# Patient Record
Sex: Female | Born: 1950 | Race: White | Hispanic: No | Marital: Married | Smoking: Never smoker
Health system: Southern US, Community
[De-identification: ages and names within clinical notes are randomized; demographics above are authoritative.]

## PROBLEM LIST (undated history)

## (undated) DIAGNOSIS — F32A Depression, unspecified: Secondary | ICD-10-CM

## (undated) DIAGNOSIS — F411 Generalized anxiety disorder: Secondary | ICD-10-CM

## (undated) DIAGNOSIS — M1990 Unspecified osteoarthritis, unspecified site: Secondary | ICD-10-CM

## (undated) DIAGNOSIS — F329 Major depressive disorder, single episode, unspecified: Secondary | ICD-10-CM

## (undated) DIAGNOSIS — M810 Age-related osteoporosis without current pathological fracture: Secondary | ICD-10-CM

## (undated) DIAGNOSIS — Z862 Personal history of diseases of the blood and blood-forming organs and certain disorders involving the immune mechanism: Secondary | ICD-10-CM

## (undated) HISTORY — DX: Generalized anxiety disorder: F41.1

## (undated) HISTORY — DX: Unspecified osteoarthritis, unspecified site: M19.90

## (undated) HISTORY — PX: FOOT SURGERY: SHX648

## (undated) HISTORY — PX: DILATION AND CURETTAGE OF UTERUS: SHX78

---

## 1999-06-29 ENCOUNTER — Other Ambulatory Visit: Admission: RE | Admit: 1999-06-29 | Discharge: 1999-06-29 | Payer: Self-pay | Admitting: Obstetrics and Gynecology

## 1999-10-13 ENCOUNTER — Encounter: Admission: RE | Admit: 1999-10-13 | Discharge: 1999-10-13 | Payer: Self-pay | Admitting: *Deleted

## 1999-10-13 ENCOUNTER — Encounter: Payer: Self-pay | Admitting: *Deleted

## 2001-05-01 ENCOUNTER — Encounter: Admission: RE | Admit: 2001-05-01 | Discharge: 2001-05-01 | Payer: Self-pay

## 2001-05-01 ENCOUNTER — Encounter: Payer: Self-pay | Admitting: Obstetrics & Gynecology

## 2001-05-29 ENCOUNTER — Other Ambulatory Visit: Admission: RE | Admit: 2001-05-29 | Discharge: 2001-05-29 | Payer: Self-pay | Admitting: Obstetrics and Gynecology

## 2002-05-19 ENCOUNTER — Encounter: Admission: RE | Admit: 2002-05-19 | Discharge: 2002-05-19 | Payer: Self-pay | Admitting: Obstetrics and Gynecology

## 2002-05-19 ENCOUNTER — Encounter: Payer: Self-pay | Admitting: Obstetrics and Gynecology

## 2002-06-04 ENCOUNTER — Other Ambulatory Visit: Admission: RE | Admit: 2002-06-04 | Discharge: 2002-06-04 | Payer: Self-pay | Admitting: Obstetrics and Gynecology

## 2003-06-03 ENCOUNTER — Encounter: Admission: RE | Admit: 2003-06-03 | Discharge: 2003-06-03 | Payer: Self-pay | Admitting: Obstetrics and Gynecology

## 2003-08-03 ENCOUNTER — Other Ambulatory Visit: Admission: RE | Admit: 2003-08-03 | Discharge: 2003-08-03 | Payer: Self-pay | Admitting: Obstetrics and Gynecology

## 2003-08-25 ENCOUNTER — Encounter: Admission: RE | Admit: 2003-08-25 | Discharge: 2003-08-25 | Payer: Self-pay | Admitting: Obstetrics and Gynecology

## 2004-09-27 ENCOUNTER — Encounter: Admission: RE | Admit: 2004-09-27 | Discharge: 2004-09-27 | Payer: Self-pay | Admitting: Obstetrics and Gynecology

## 2004-10-26 ENCOUNTER — Other Ambulatory Visit: Admission: RE | Admit: 2004-10-26 | Discharge: 2004-10-26 | Payer: Self-pay | Admitting: Obstetrics and Gynecology

## 2004-11-02 ENCOUNTER — Encounter: Admission: RE | Admit: 2004-11-02 | Discharge: 2004-11-02 | Payer: Self-pay | Admitting: Obstetrics and Gynecology

## 2005-10-12 ENCOUNTER — Encounter: Admission: RE | Admit: 2005-10-12 | Discharge: 2005-10-12 | Payer: Self-pay | Admitting: Family Medicine

## 2005-12-14 ENCOUNTER — Other Ambulatory Visit: Admission: RE | Admit: 2005-12-14 | Discharge: 2005-12-14 | Payer: Self-pay | Admitting: Obstetrics and Gynecology

## 2006-10-15 ENCOUNTER — Encounter: Admission: RE | Admit: 2006-10-15 | Discharge: 2006-10-15 | Payer: Self-pay | Admitting: Obstetrics and Gynecology

## 2007-11-05 ENCOUNTER — Encounter: Admission: RE | Admit: 2007-11-05 | Discharge: 2007-11-05 | Payer: Self-pay | Admitting: Obstetrics and Gynecology

## 2008-12-22 ENCOUNTER — Encounter: Admission: RE | Admit: 2008-12-22 | Discharge: 2008-12-22 | Payer: Self-pay | Admitting: Obstetrics and Gynecology

## 2008-12-30 ENCOUNTER — Ambulatory Visit: Payer: Self-pay | Admitting: Orthopedic Surgery

## 2008-12-30 DIAGNOSIS — M7150 Other bursitis, not elsewhere classified, unspecified site: Secondary | ICD-10-CM | POA: Insufficient documentation

## 2008-12-30 DIAGNOSIS — M25549 Pain in joints of unspecified hand: Secondary | ICD-10-CM

## 2009-01-06 ENCOUNTER — Ambulatory Visit: Payer: Self-pay | Admitting: Orthopedic Surgery

## 2010-01-04 ENCOUNTER — Encounter: Admission: RE | Admit: 2010-01-04 | Discharge: 2010-01-04 | Payer: Self-pay | Admitting: Obstetrics and Gynecology

## 2010-07-15 ENCOUNTER — Encounter
Admission: RE | Admit: 2010-07-15 | Discharge: 2010-07-15 | Payer: Self-pay | Source: Home / Self Care | Attending: Obstetrics and Gynecology | Admitting: Obstetrics and Gynecology

## 2011-02-02 ENCOUNTER — Other Ambulatory Visit: Payer: Self-pay | Admitting: Obstetrics and Gynecology

## 2011-02-02 DIAGNOSIS — Z1231 Encounter for screening mammogram for malignant neoplasm of breast: Secondary | ICD-10-CM

## 2011-02-09 ENCOUNTER — Ambulatory Visit
Admission: RE | Admit: 2011-02-09 | Discharge: 2011-02-09 | Disposition: A | Discharge status: Home / Self Care | Payer: 59 | Source: Ambulatory Visit | Attending: Obstetrics and Gynecology | Admitting: Obstetrics and Gynecology

## 2011-02-09 DIAGNOSIS — Z1231 Encounter for screening mammogram for malignant neoplasm of breast: Secondary | ICD-10-CM

## 2011-02-13 ENCOUNTER — Ambulatory Visit: Payer: Self-pay

## 2011-09-13 ENCOUNTER — Telehealth (INDEPENDENT_AMBULATORY_CARE_PROVIDER_SITE_OTHER): Payer: Self-pay | Admitting: *Deleted

## 2011-09-13 ENCOUNTER — Encounter (INDEPENDENT_AMBULATORY_CARE_PROVIDER_SITE_OTHER): Payer: Self-pay | Admitting: *Deleted

## 2011-09-13 ENCOUNTER — Other Ambulatory Visit (INDEPENDENT_AMBULATORY_CARE_PROVIDER_SITE_OTHER): Payer: Self-pay | Admitting: *Deleted

## 2011-09-13 DIAGNOSIS — Z1211 Encounter for screening for malignant neoplasm of colon: Secondary | ICD-10-CM

## 2011-09-13 NOTE — Telephone Encounter (Signed)
Patient needs movi prep 

## 2011-09-14 MED ORDER — PEG-KCL-NACL-NASULF-NA ASC-C 100 G PO SOLR: 1.0000 | Freq: Once | ORAL | Status: DC

## 2011-10-17 ENCOUNTER — Telehealth (INDEPENDENT_AMBULATORY_CARE_PROVIDER_SITE_OTHER): Payer: Self-pay | Admitting: *Deleted

## 2011-10-17 NOTE — Telephone Encounter (Signed)
Requesting MD/PCP:  Lorin Picket luking     Name & DOB: Tiffany Perry 12-12-2050     Procedure: tcs  Reason/Indication:  Screening, fam hx colon ca  Has patient had this procedure before?  no  If so, when, by whom and where?    Is there a family history of colon cancer?  yes  Who?  What age when diagnosed?  cousin  Is patient diabetic?   no      Does patient have prosthetic heart valve?  no  Do you have a pacemaker?  no  Has patient had joint replacement within last 12 months?  no  Is patient on Coumadin, Plavix and/or Aspirin? no  Medications: welbutrin bid  Allergies: codiene  Pharmacy: [facility]   Medication Adjustment: none  Procedure date & time: 11/16/11 at 730

## 2011-10-19 NOTE — Telephone Encounter (Signed)
agree

## 2011-11-14 ENCOUNTER — Encounter (HOSPITAL_COMMUNITY): Payer: Self-pay | Admitting: Pharmacy Technician

## 2011-11-15 MED ORDER — SODIUM CHLORIDE 0.45 % IV SOLN
Freq: Once | INTRAVENOUS | Status: AC
  Administered 2011-11-16: 10 mL/h via INTRAVENOUS

## 2011-11-16 ENCOUNTER — Encounter (HOSPITAL_COMMUNITY): Payer: Self-pay | Admitting: *Deleted

## 2011-11-16 ENCOUNTER — Ambulatory Visit (HOSPITAL_COMMUNITY)
Admission: RE | Admit: 2011-11-16 | Discharge: 2011-11-16 | Disposition: A | Discharge status: Home Health | Payer: 59 | Source: Ambulatory Visit | Attending: Internal Medicine | Admitting: Internal Medicine

## 2011-11-16 ENCOUNTER — Encounter (HOSPITAL_COMMUNITY)
Admission: RE | Disposition: A | Discharge status: Home Health | Payer: Self-pay | Source: Ambulatory Visit | Attending: Internal Medicine

## 2011-11-16 DIAGNOSIS — Z1211 Encounter for screening for malignant neoplasm of colon: Secondary | ICD-10-CM

## 2011-11-16 DIAGNOSIS — D126 Benign neoplasm of colon, unspecified: Secondary | ICD-10-CM

## 2011-11-16 HISTORY — DX: Depression, unspecified: F32.A

## 2011-11-16 HISTORY — DX: Major depressive disorder, single episode, unspecified: F32.9

## 2011-11-16 HISTORY — PX: COLONOSCOPY: SHX5424

## 2011-11-16 SURGERY — COLONOSCOPY
Anesthesia: Moderate Sedation

## 2011-11-16 MED ORDER — SODIUM CHLORIDE 0.45 % IV SOLN
INTRAVENOUS | Status: DC
  Administered 2011-11-16: 20 mL via INTRAVENOUS

## 2011-11-16 MED ORDER — MIDAZOLAM HCL 5 MG/5ML IJ SOLN
INTRAMUSCULAR | Status: DC | PRN
  Administered 2011-11-16 (×2): 2 mg via INTRAVENOUS
  Administered 2011-11-16 (×2): 1 mg via INTRAVENOUS

## 2011-11-16 MED ORDER — STERILE WATER FOR IRRIGATION IR SOLN
Status: DC | PRN
  Administered 2011-11-16: 08:00:00

## 2011-11-16 MED ORDER — MEPERIDINE HCL 50 MG/ML IJ SOLN
INTRAMUSCULAR | Status: AC
  Filled 2011-11-16: qty 1

## 2011-11-16 MED ORDER — MIDAZOLAM HCL 5 MG/5ML IJ SOLN
INTRAMUSCULAR | Status: AC
  Filled 2011-11-16: qty 10

## 2011-11-16 MED ORDER — MEPERIDINE HCL 50 MG/ML IJ SOLN
INTRAMUSCULAR | Status: DC | PRN
  Administered 2011-11-16: 25 mg via INTRAVENOUS

## 2011-11-16 NOTE — H&P (Signed)
Tiffany Perry is an 61 y.o. female.   Chief Complaint: Patient is here for colonoscopy. HPI: Patient is 61 year old Caucasian female who is undergoing screening colonoscopy. This is patient's first exam. She denies rectal bleeding change in bowel habits or abdominal pain. Family history is significant for a colon carcinoma in his 110,s  Past Medical History  Diagnosis Date  . Depression     Past Surgical History  Procedure Date  . Foot surgery   . Dilation and curettage of uterus     History reviewed. No pertinent family history. Social History:  reports that she has never smoked. She does not have any smokeless tobacco history on file. Her alcohol and drug histories not on file.  Allergies:  Allergies  Allergen Reactions  . Codeine Nausea And Vomiting    Medications Prior to Admission  Medication Dose Route Frequency Provider Last Rate Last Dose  . 0.45 % sodium chloride infusion   Intravenous Once Malissa Hippo, MD 10 mL/hr at 11/16/11 0708 10 mL/hr at 11/16/11 0708  . 0.45 % sodium chloride infusion   Intravenous Continuous Malissa Hippo, MD 20 mL/hr at 11/16/11 0724 20 mL at 11/16/11 0724  . meperidine (DEMEROL) 50 MG/ML injection           . midazolam (VERSED) 5 MG/5ML injection            Medications Prior to Admission  Medication Sig Dispense Refill  . peg 3350 powder (MOVIPREP) 100 G SOLR Take 1 kit (100 g total) by mouth once.  1 kit  0    No results found for this or any previous visit (from the past 48 hour(s)). No results found.  Review of Systems  Constitutional: Negative for weight loss.  Gastrointestinal: Negative for abdominal pain, diarrhea, constipation, blood in stool and melena.    Blood pressure 116/71, pulse 66, temperature 97.8 F (36.6 C), temperature source Oral, resp. rate 16, SpO2 98.00%. Physical Exam  Constitutional: She appears well-developed and well-nourished.  HENT:  Mouth/Throat: Oropharynx is clear and moist.  Eyes:  Conjunctivae are normal. No scleral icterus.  Neck: No thyromegaly present.  Cardiovascular: Normal rate, regular rhythm and normal heart sounds.   Respiratory: Effort normal.  GI: Soft. She exhibits no distension and no mass. There is no tenderness.  Lymphadenopathy:    She has no cervical adenopathy.  Neurological: She is alert.  Skin: Skin is warm and dry.     Assessment/Plan Average risk screening colonoscopy.  Tiffany Perry U 11/16/2011, 7:32 AM

## 2011-11-16 NOTE — Op Note (Signed)
COLONOSCOPY PROCEDURE REPORT  PATIENT:  Tiffany Perry  MR#:  [id number] Birthdate:  Jul 10, 1951, 61 y.o., female Endoscopist:  Dr. Malissa Hippo, MD Referred By:  Dr. Jonna Coup. Gerda Diss, M.D. Procedure Date: 11/16/2011  Procedure:   Colonoscopy with snare polypectomy and Hemoclip application to cecal polypectomy site.  Indications:  Patient is 61 year old Caucasian female who is here for screening colonoscopy. This is patient's first exam. She has a first cousin who is treated for colon carcinoma in his 35s.  Informed Consent:  The procedure and risks were reviewed with the patient and informed consent was obtained.  Medications:  Demerol 25 mg IV Versed 6 mg IV  Description of procedure:  After a digital rectal exam was performed, that colonoscope was advanced from the anus through the rectum and colon to the area of the cecum, ileocecal valve and appendiceal orifice. The cecum was deeply intubated. These structures were well-seen and photographed for the record. From the level of the cecum and ileocecal valve, the scope was slowly and cautiously withdrawn. The mucosal surfaces were carefully surveyed utilizing scope tip to flexion to facilitate fold flattening as needed. The scope was pulled down into the rectum where a thorough exam including retroflexion was performed.  Findings:   Prep excellent. Irregular shaped sessile cecal polyp with maximal diameter of 16 mm. Saline-assisted polypectomy performed. Polyp removed in 2 fragments. Residual polyp in the coagulated with snare tip. 2 hemoclips applied to polypectomy site. 2 small polyps cold snared from ascending colon and submitted together. Small polyp at sigmoid colon coagulated using snare tip. 2 polyps snared from sigmoid colon and submitted together about 6 and 8 mm in diameter. Normal rectal mucosa and anal rectal junction.  Therapeutic/Diagnostic Maneuvers Performed:  See above  Complications:  None  Cecal Withdrawal Time:   27 minutes  Impression:  Examination performed to cecum. 17 mm sessile cecal polyp snared piecemeal following saline injection and two hemoclips applied to polypectomy site. two polyps snared from sigmoid colon and submitted together. Two small polyps cold snared from ascending colon and submitted together. Small polyp at sigmoid colon was coagulated.  Recommendations:  Standard instructions given. Patient advised not to have MRI until documented to have passed Hemoclips. I would be contacting patient with results of biopsy and further  Camil Wilhelmsen U  11/16/2011 8:29 AM  CC: Dr. Lilyan Punt, MD, MD & Dr. Bonnetta Barry ref. provider found

## 2011-11-16 NOTE — Discharge Instructions (Signed)
No aspirin or OTC NSAIDs for 10 days. Resume usual medications and diet. No driving for 24 hours. Physician will contact you with  biopsy results. Cannot have MRI until hemoclips have passed  Colonoscopy Care After Read the instructions outlined below and refer to this sheet in the next few weeks. These discharge instructions provide you with general information on caring for yourself after you leave the hospital. Your doctor may also give you specific instructions. While your treatment has been planned according to the most current medical practices available, unavoidable complications occasionally occur. If you have any problems or questions after discharge, call your doctor. HOME CARE INSTRUCTIONS ACTIVITY:  You may resume your regular activity, but move at a slower pace for the next 24 hours.   Take frequent rest periods for the next 24 hours.   Walking will help get rid of the air and reduce the bloated feeling in your belly (abdomen).   No driving for 24 hours (because of the medicine (anesthesia) used during the test).   You may shower.   Do not sign any important legal documents or operate any machinery for 24 hours (because of the anesthesia used during the test).  NUTRITION:  Drink plenty of fluids.   You may resume your normal diet as instructed by your doctor.   Begin with a light meal and progress to your normal diet. Heavy or fried foods are harder to digest and may make you feel sick to your stomach (nauseated).   Avoid alcoholic beverages for 24 hours or as instructed.  MEDICATIONS:  You may resume your normal medications unless your doctor tells you otherwise.  WHAT TO EXPECT TODAY:  Some feelings of bloating in the abdomen.   Passage of more gas than usual.   Spotting of blood in your stool or on the toilet paper.  IF YOU HAD POLYPS REMOVED DURING THE COLONOSCOPY:  No aspirin products for 7 days or as instructed.   No alcohol for 7 days or as  instructed.   Eat a soft diet for the next 24 hours.  FINDING OUT THE RESULTS OF YOUR TEST Not all test results are available during your visit. If your test results are not back during the visit, make an appointment with your caregiver to find out the results. Do not assume everything is normal if you have not heard from your caregiver or the medical facility. It is important for you to follow up on all of your test results.  SEEK IMMEDIATE MEDICAL CARE IF:  You have more than a spotting of blood in your stool.   Your belly is swollen (abdominal distention).   You are nauseated or vomiting.   You have a fever.   You have abdominal pain or discomfort that is severe or gets worse throughout the day.  Document Released: 02/29/2004 Document Revised: 07/06/2011 Document Reviewed: 02/27/2008 [facility] Patient Information 2012 [place], [place].        Colon Polyps A polyp is extra tissue that grows inside your body. Colon polyps grow in the large intestine. The large intestine, also called the colon, is part of your digestive system. It is a long, hollow tube at the end of your digestive tract where your body makes and stores stool. Most polyps are not dangerous. They are benign. This means they are not cancerous. But over time, some types of polyps can turn into cancer. Polyps that are smaller than a pea are usually not harmful. But larger polyps could someday become or  may already be cancerous. To be safe, doctors remove all polyps and test them.  WHO GETS POLYPS? Anyone can get polyps, but certain people are more likely than others. You may have a greater chance of getting polyps if:  You are over 50.   You have had polyps before.   Someone in your family has had polyps.   Someone in your family has had cancer of the large intestine.   Find out if someone in your family has had polyps. You may also be more likely to get polyps if you:   Eat a lot of fatty foods.   Smoke.    Drink alcohol.   Do not exercise.   Eat too much.  SYMPTOMS  Most small polyps do not cause symptoms. People often do not know they have one until their caregiver finds it during a regular checkup or while testing them for something else. Some people do have symptoms like these:  Bleeding from the anus. You might notice blood on your underwear or on toilet paper after you have had a bowel movement.   Constipation or diarrhea that lasts more than a week.   Blood in the stool. Blood can make stool look black or it can show up as red streaks in the stool.  If you have any of these symptoms, see your caregiver. HOW DOES THE DOCTOR TEST FOR POLYPS? The doctor can use four tests to check for polyps:  Digital rectal exam. The caregiver wears gloves and checks your rectum (the last part of the large intestine) to see if it feels normal. This test would find polyps only in the rectum. Your caregiver may need to do one of the other tests listed below to find polyps higher up in the intestine.   Barium enema. The caregiver puts a liquid called barium into your rectum before taking x-rays of your large intestine. Barium makes your intestine look white in the pictures. Polyps are dark, so they are easy to see.   Sigmoidoscopy. With this test, the caregiver can see inside your large intestine. A thin flexible tube is placed into your rectum. The device is called a sigmoidoscope, which has a light and a tiny video camera in it. The caregiver uses the sigmoidoscope to look at the last third of your large intestine.   Colonoscopy. This test is like sigmoidoscopy, but the caregiver looks at all of the large intestine. It usually requires sedation. This is the most common method for finding and removing polyps.  TREATMENT   The caregiver will remove the polyp during sigmoidoscopy or colonoscopy. The polyp is then tested for cancer.   If you have had polyps, your caregiver may want you to get tested  regularly in the future.  PREVENTION  There is not one sure way to prevent polyps. You might be able to lower your risk of getting them if you:  Eat more fruits and vegetables and less fatty food.   Do not smoke.   Avoid alcohol.   Exercise every day.   Lose weight if you are overweight.   Eating more calcium and folate can also lower your risk of getting polyps. Some foods that are rich in calcium are milk, cheese, and broccoli. Some foods that are rich in folate are chickpeas, kidney beans, and spinach.   Aspirin might help prevent polyps. Studies are under way.  Document Released: 04/12/2004 Document Revised: 07/06/2011 Document Reviewed: 09/18/2007 [facility] Patient Information 2012 [place], [place].

## 2011-11-20 ENCOUNTER — Encounter (INDEPENDENT_AMBULATORY_CARE_PROVIDER_SITE_OTHER): Payer: Self-pay | Admitting: *Deleted

## 2011-11-21 ENCOUNTER — Encounter (HOSPITAL_COMMUNITY): Payer: Self-pay | Admitting: Internal Medicine

## 2012-03-01 ENCOUNTER — Other Ambulatory Visit: Payer: Self-pay | Admitting: Obstetrics and Gynecology

## 2012-03-01 DIAGNOSIS — Z1231 Encounter for screening mammogram for malignant neoplasm of breast: Secondary | ICD-10-CM

## 2012-03-21 ENCOUNTER — Ambulatory Visit: Payer: 59

## 2012-04-11 ENCOUNTER — Ambulatory Visit
Admission: RE | Admit: 2012-04-11 | Discharge: 2012-04-11 | Disposition: A | Discharge status: Home / Self Care | Payer: 59 | Source: Ambulatory Visit | Attending: Obstetrics and Gynecology | Admitting: Obstetrics and Gynecology

## 2012-04-11 DIAGNOSIS — Z1231 Encounter for screening mammogram for malignant neoplasm of breast: Secondary | ICD-10-CM

## 2012-04-24 ENCOUNTER — Encounter: Payer: Self-pay | Admitting: Obstetrics and Gynecology

## 2013-01-21 ENCOUNTER — Other Ambulatory Visit: Payer: Self-pay | Admitting: Family Medicine

## 2013-01-22 ENCOUNTER — Encounter: Payer: Self-pay | Admitting: *Deleted

## 2013-05-05 ENCOUNTER — Other Ambulatory Visit: Payer: Self-pay | Admitting: Family Medicine

## 2013-05-21 ENCOUNTER — Other Ambulatory Visit: Payer: Self-pay

## 2013-05-21 DIAGNOSIS — Z1231 Encounter for screening mammogram for malignant neoplasm of breast: Secondary | ICD-10-CM

## 2013-06-17 ENCOUNTER — Ambulatory Visit
Admission: RE | Admit: 2013-06-17 | Discharge: 2013-06-17 | Disposition: A | Discharge status: Home / Self Care | Payer: BC Managed Care – PPO | Source: Ambulatory Visit

## 2013-06-17 DIAGNOSIS — Z1231 Encounter for screening mammogram for malignant neoplasm of breast: Secondary | ICD-10-CM

## 2013-07-25 ENCOUNTER — Other Ambulatory Visit: Payer: Self-pay | Admitting: *Deleted

## 2013-07-25 MED ORDER — ALPRAZOLAM 0.5 MG PO TABS: 0.2500 mg | ORAL_TABLET | Freq: Two times a day (BID) | ORAL | Status: DC | PRN

## 2013-12-04 ENCOUNTER — Other Ambulatory Visit: Payer: Self-pay | Admitting: Family Medicine

## 2013-12-05 ENCOUNTER — Other Ambulatory Visit: Payer: Self-pay | Admitting: Family Medicine

## 2014-01-06 ENCOUNTER — Telehealth: Payer: Self-pay | Admitting: Family Medicine

## 2014-01-06 MED ORDER — PREDNISONE 20 MG PO TABS: ORAL_TABLET | ORAL | Status: DC

## 2014-01-06 NOTE — Telephone Encounter (Signed)
Discussed with patient. Patient would like to go with oral prednisone. Rx sent electronically to pharmacy. Patient notified.

## 2014-01-06 NOTE — Telephone Encounter (Signed)
Typically in prednisone used for this. This will take care of it vastus. If patient does not want prednisone we can do a steroid cream which would take longer. It using prednisone I recommend prednisone 20 mg, #18,3qd for 3d then 2qd for 3d then 1qd for 3d Certainly followup if ongoing troubles

## 2014-01-06 NOTE — Addendum Note (Signed)
Addended by: Dairl Ponder on: 01/06/2014 11:19 AM   Modules accepted: Orders

## 2014-01-06 NOTE — Telephone Encounter (Signed)
Patient says that she has poison ivy above her right knee, under breast, on head. Wants to know if we can call something in.  [facility]

## 2014-03-25 ENCOUNTER — Ambulatory Visit (INDEPENDENT_AMBULATORY_CARE_PROVIDER_SITE_OTHER): Payer: BC Managed Care – PPO | Admitting: Nurse Practitioner

## 2014-03-25 ENCOUNTER — Encounter: Payer: Self-pay | Admitting: Nurse Practitioner

## 2014-03-25 VITALS — BP 130/82 | Ht 64.0 in | Wt 125.0 lb

## 2014-03-25 DIAGNOSIS — N390 Urinary tract infection, site not specified: Secondary | ICD-10-CM

## 2014-03-25 DIAGNOSIS — R319 Hematuria, unspecified: Principal | ICD-10-CM

## 2014-03-25 DIAGNOSIS — J069 Acute upper respiratory infection, unspecified: Secondary | ICD-10-CM

## 2014-03-25 LAB — POCT URINALYSIS DIPSTICK
Blood, UA: 10
SPEC GRAV UA: 1.015
pH, UA: 7

## 2014-03-25 MED ORDER — CEFPROZIL 500 MG PO TABS: 500.0000 mg | ORAL_TABLET | Freq: Two times a day (BID) | ORAL | Status: DC

## 2014-03-25 NOTE — Patient Instructions (Signed)
AZO as directed for 48 hours then stop 

## 2014-03-31 ENCOUNTER — Encounter: Payer: Self-pay | Admitting: Nurse Practitioner

## 2014-03-31 LAB — POCT UA - MICROSCOPIC ONLY
Bacteria, U Microscopic: POSITIVE
RBC, URINE, MICROSCOPIC: NEGATIVE

## 2014-03-31 NOTE — Progress Notes (Signed)
Subjective:  Presents for complaints of urinary symptoms that began 5 days ago. Was coming back from a trip to [place], on a plane for about 9 hours. Began with pressure in the bladder, frequency with some blood. Still having urgency frequency and pain especially at the end of urination. No fever but some chills. Over the past couple of days has developed frequent cough producing clear mucus. Generalized headache. Bodyaches. No wheezing. No vomiting. Slight loose stools. No abdominal pain. Sore throat. Ear pain. Minimal mid back pain. Taking fluids well.  Objective:   BP 130/82  Ht 5\' 4"  (1.626 m)  Wt 125 lb (56.7 kg)  BMI 21.45 kg/m2 NAD. Alert, oriented. TMs mild clear effusion, no erythema. Pharynx minimal erythema, PND noted. Neck supple with mild soft anterior adenopathy. Lungs clear. Heart regular rate rhythm. No CVA or flank tenderness. Abdomen soft nondistended with mild suprapubic area discomfort on exam. Results for orders placed in visit on 03/25/14  POCT URINALYSIS DIPSTICK      Result Value Ref Range   Color, UA       Clarity, UA       Glucose, UA       Bilirubin, UA       Ketones, UA       Spec Grav, UA 1.015     Blood, UA 10     pH, UA 7.0     Protein, UA       Urobilinogen, UA       Nitrite, UA       Leukocytes, UA 4+    POCT UA - MICROSCOPIC ONLY      Result Value Ref Range   WBC, Ur, HPF, POC 5+     RBC, urine, microscopic neg     Bacteria, U Microscopic pos     Mucus, UA       Epithelial cells, urine per micros mutliple     Crystals, Ur, HPF, POC       Casts, Ur, LPF, POC       Yeast, UA         Assessment:Urinary tract infection with hematuria, site unspecified - Plan: POCT urinalysis dipstick  Upper respiratory infection  Plan: Meds ordered this encounter  Medications  . cefPROZIL (CEFZIL) 500 MG tablet    Sig: Take 1 tablet (500 mg total) by mouth 2 (two) times daily.    Dispense:  14 tablet    Refill:  0    Order Specific Question:  Supervising  Provider    Answer:  Mikey Kirschner [[id number]]   AZO for 48 hours then stop. Explained that respiratory symptoms are most likely viral in nature. Reviewed symptomatic care and warning signs. Because urinary sample was of poor, was not sent for urine culture. Increase clear fluid intake. Call back in 48 hours if no improvement, sooner if worse.

## 2014-04-08 ENCOUNTER — Ambulatory Visit (INDEPENDENT_AMBULATORY_CARE_PROVIDER_SITE_OTHER): Payer: BC Managed Care – PPO | Admitting: Family Medicine

## 2014-04-08 ENCOUNTER — Encounter: Payer: Self-pay | Admitting: Family Medicine

## 2014-04-08 VITALS — BP 112/70 | Temp 98.5°F | Ht 64.0 in | Wt 127.0 lb

## 2014-04-08 DIAGNOSIS — Z79899 Other long term (current) drug therapy: Secondary | ICD-10-CM

## 2014-04-08 DIAGNOSIS — R109 Unspecified abdominal pain: Secondary | ICD-10-CM

## 2014-04-08 DIAGNOSIS — Z0189 Encounter for other specified special examinations: Secondary | ICD-10-CM

## 2014-04-08 DIAGNOSIS — M545 Low back pain, unspecified: Secondary | ICD-10-CM

## 2014-04-08 DIAGNOSIS — R5383 Other fatigue: Secondary | ICD-10-CM

## 2014-04-08 DIAGNOSIS — N390 Urinary tract infection, site not specified: Secondary | ICD-10-CM

## 2014-04-08 DIAGNOSIS — R5381 Other malaise: Secondary | ICD-10-CM

## 2014-04-08 LAB — POCT URINALYSIS DIPSTICK
Spec Grav, UA: <=1.005
pH, UA: 7

## 2014-04-08 NOTE — Progress Notes (Signed)
   Subjective:    Patient ID: Tiffany Perry, female    DOB: June 02, 1951, 63 y.o.   MRN: [id number]  Back Pain This is a new problem. The current episode started 1 to 4 weeks ago. The problem occurs intermittently. The problem is unchanged. The pain is present in the lumbar spine. The quality of the pain is described as aching. The pain does not radiate. The pain is moderate. The pain is the same all the time. (Fatigue) Treatments tried: antibiotic. The treatment provided no relief.   Patient was diagnosed with a UTI on 03/25/14.  No other concerns at this time.   Review of Systems  Musculoskeletal: Positive for back pain.   denies high fever chills sweats     Objective:   Physical Exam  Lungs are clear heart regular abdomen soft no guarding rebound urine that showed occasional WBC Lower back minimal tenderness negative straight leg raise      Assessment & Plan:  Lumbar pain-I doubt this is due to urinary tract infection but we will do a urine culture. Hold off on antibiotics  Intermittent lower abdominal discomfort will be doing some lab work I do not feel that this patient needs a CT scan I do recommend that she go forward with seen her gynecologist. She states she has a followup coming up on off on any scans or ultrasounds currently

## 2014-04-09 LAB — LIPID PANEL
CHOL/HDL RATIO: 2.2 ratio
Cholesterol: 175 mg/dL (ref 0–200)
HDL: 81 mg/dL (ref >39)
LDL CALC: 86 mg/dL (ref 0–99)
Triglycerides: 40 mg/dL (ref <150)
VLDL: 8 mg/dL (ref 0–40)

## 2014-04-09 LAB — CBC WITH DIFFERENTIAL/PLATELET
BASOS ABS: 0 10*3/uL (ref 0.0–0.1)
BASOS PCT: 1 % (ref 0–1)
Eosinophils Absolute: 0.2 10*3/uL (ref 0.0–0.7)
Eosinophils Relative: 5 % (ref 0–5)
HEMATOCRIT: 41.3 % (ref 36.0–46.0)
HEMOGLOBIN: 14.1 g/dL (ref 12.0–15.0)
LYMPHS PCT: 33 % (ref 12–46)
Lymphs Abs: 1.6 10*3/uL (ref 0.7–4.0)
MCH: 30.7 pg (ref 26.0–34.0)
MCHC: 34.1 g/dL (ref 30.0–36.0)
MCV: 89.8 fL (ref 78.0–100.0)
MONO ABS: 0.5 10*3/uL (ref 0.1–1.0)
MONOS PCT: 11 % (ref 3–12)
NEUTROS ABS: 2.5 10*3/uL (ref 1.7–7.7)
NEUTROS PCT: 50 % (ref 43–77)
Platelets: 312 10*3/uL (ref 150–400)
RBC: 4.6 MIL/uL (ref 3.87–5.11)
RDW: 12.8 % (ref 11.5–15.5)
WBC: 4.9 10*3/uL (ref 4.0–10.5)

## 2014-04-09 LAB — HEPATIC FUNCTION PANEL
ALK PHOS: 85 U/L (ref 39–117)
ALT: 15 U/L (ref 0–35)
AST: 15 U/L (ref 0–37)
Albumin: 4.2 g/dL (ref 3.5–5.2)
BILIRUBIN DIRECT: 0.1 mg/dL (ref 0.0–0.3)
BILIRUBIN TOTAL: 0.6 mg/dL (ref 0.2–1.2)
Indirect Bilirubin: 0.5 mg/dL (ref 0.2–1.2)
Total Protein: 6.5 g/dL (ref 6.0–8.3)

## 2014-04-09 LAB — BASIC METABOLIC PANEL
BUN: 15 mg/dL (ref 6–23)
CHLORIDE: 105 meq/L (ref 96–112)
CO2: 29 mEq/L (ref 19–32)
Calcium: 9.3 mg/dL (ref 8.4–10.5)
Creat: 0.86 mg/dL (ref 0.50–1.10)
GLUCOSE: 81 mg/dL (ref 70–99)
POTASSIUM: 4.5 meq/L (ref 3.5–5.3)
SODIUM: 140 meq/L (ref 135–145)

## 2014-04-09 LAB — TSH: TSH: 2.562 u[IU]/mL (ref 0.350–4.500)

## 2014-04-10 ENCOUNTER — Encounter: Payer: Self-pay | Admitting: Family Medicine

## 2014-04-10 LAB — URINE CULTURE

## 2014-05-26 ENCOUNTER — Other Ambulatory Visit: Payer: Self-pay

## 2014-05-26 DIAGNOSIS — Z1231 Encounter for screening mammogram for malignant neoplasm of breast: Secondary | ICD-10-CM

## 2014-06-29 ENCOUNTER — Ambulatory Visit
Admission: RE | Admit: 2014-06-29 | Discharge: 2014-06-29 | Disposition: A | Discharge status: Home / Self Care | Payer: BC Managed Care – PPO | Source: Ambulatory Visit

## 2014-06-29 ENCOUNTER — Other Ambulatory Visit: Payer: Self-pay

## 2014-06-29 DIAGNOSIS — Z1231 Encounter for screening mammogram for malignant neoplasm of breast: Secondary | ICD-10-CM

## 2014-06-29 DIAGNOSIS — N951 Menopausal and female climacteric states: Secondary | ICD-10-CM

## 2014-07-02 ENCOUNTER — Other Ambulatory Visit: Payer: Self-pay | Admitting: Obstetrics and Gynecology

## 2014-07-02 DIAGNOSIS — M810 Age-related osteoporosis without current pathological fracture: Secondary | ICD-10-CM

## 2014-07-07 ENCOUNTER — Other Ambulatory Visit: Payer: Self-pay | Admitting: Obstetrics and Gynecology

## 2014-07-07 DIAGNOSIS — M8580 Other specified disorders of bone density and structure, unspecified site: Secondary | ICD-10-CM

## 2014-07-16 ENCOUNTER — Ambulatory Visit
Admission: RE | Admit: 2014-07-16 | Discharge: 2014-07-16 | Disposition: A | Discharge status: Home / Self Care | Payer: BC Managed Care – PPO | Source: Ambulatory Visit | Attending: Obstetrics and Gynecology | Admitting: Obstetrics and Gynecology

## 2014-07-16 DIAGNOSIS — M8580 Other specified disorders of bone density and structure, unspecified site: Secondary | ICD-10-CM

## 2014-08-04 ENCOUNTER — Encounter: Payer: Self-pay | Admitting: Family Medicine

## 2014-08-04 ENCOUNTER — Ambulatory Visit (INDEPENDENT_AMBULATORY_CARE_PROVIDER_SITE_OTHER): Payer: BLUE CROSS/BLUE SHIELD | Admitting: Family Medicine

## 2014-08-04 VITALS — BP 102/68 | Temp 98.7°F | Ht 63.5 in | Wt 129.0 lb

## 2014-08-04 DIAGNOSIS — R300 Dysuria: Secondary | ICD-10-CM

## 2014-08-04 DIAGNOSIS — N3001 Acute cystitis with hematuria: Secondary | ICD-10-CM

## 2014-08-04 LAB — POCT URINALYSIS DIPSTICK: pH, UA: 6

## 2014-08-04 MED ORDER — CEFPROZIL 500 MG PO TABS: 500.0000 mg | ORAL_TABLET | Freq: Two times a day (BID) | ORAL | Status: DC

## 2014-08-04 NOTE — Progress Notes (Signed)
   Subjective:    Patient ID: Tiffany Perry, female    DOB: 1951-04-22, 64 y.o.   MRN: [id number]  Dysuria  This is a new problem. The current episode started yesterday. Associated symptoms include frequency, hematuria and urgency. Treatments tried: AZO.   Patient stated that she did have some hematuria some dysuria symptoms over the past couple days no high fever chills nausea vomiting or diarrhea. She had hematuria with the previous UTI a few months ago.   Review of Systems  Genitourinary: Positive for dysuria, urgency, frequency and hematuria.       Objective:   Physical Exam  Lower abdomen minimal tenderness flanks nontender lungs clear heart regular pulse normal      Assessment & Plan:  UTI-culture sent antibiotics prescribed  Hematuria with this UTI if urine culture does not explain this issue I would recommend referral to urologist and they will need to do cystoscope.

## 2014-08-06 LAB — URINE CULTURE

## 2014-08-11 ENCOUNTER — Other Ambulatory Visit: Payer: Self-pay | Admitting: Family Medicine

## 2014-08-11 DIAGNOSIS — R319 Hematuria, unspecified: Secondary | ICD-10-CM

## 2014-08-19 ENCOUNTER — Encounter: Payer: Self-pay | Admitting: Family Medicine

## 2014-09-21 ENCOUNTER — Other Ambulatory Visit: Payer: Self-pay | Admitting: Family Medicine

## 2014-10-20 ENCOUNTER — Other Ambulatory Visit: Payer: Self-pay | Admitting: Family Medicine

## 2014-10-21 ENCOUNTER — Telehealth: Payer: Self-pay | Admitting: Family Medicine

## 2014-10-21 MED ORDER — BUPROPION HCL ER (SR) 150 MG PO TB12: ORAL_TABLET | ORAL | Status: DC

## 2014-10-21 NOTE — Telephone Encounter (Signed)
Pt will need a refill on her wellbutrin on 11/05/2014. Pt has an appt scheduled for 11/11/14.  [facility]

## 2014-10-21 NOTE — Telephone Encounter (Signed)
Notified patient med was sent to pharmacy.

## 2014-10-26 ENCOUNTER — Encounter (INDEPENDENT_AMBULATORY_CARE_PROVIDER_SITE_OTHER): Payer: Self-pay | Admitting: *Deleted

## 2014-11-11 ENCOUNTER — Encounter: Payer: Self-pay | Admitting: Family Medicine

## 2014-11-11 ENCOUNTER — Ambulatory Visit (INDEPENDENT_AMBULATORY_CARE_PROVIDER_SITE_OTHER): Payer: PRIVATE HEALTH INSURANCE | Admitting: Family Medicine

## 2014-11-11 VITALS — BP 120/72 | Ht 63.5 in | Wt 132.4 lb

## 2014-11-11 DIAGNOSIS — M810 Age-related osteoporosis without current pathological fracture: Secondary | ICD-10-CM | POA: Diagnosis not present

## 2014-11-11 DIAGNOSIS — F32A Depression, unspecified: Secondary | ICD-10-CM | POA: Insufficient documentation

## 2014-11-11 DIAGNOSIS — F329 Major depressive disorder, single episode, unspecified: Secondary | ICD-10-CM | POA: Diagnosis not present

## 2014-11-11 MED ORDER — ALENDRONATE SODIUM 70 MG PO TABS: 70.0000 mg | ORAL_TABLET | ORAL | Status: DC

## 2014-11-11 MED ORDER — BUPROPION HCL ER (SR) 150 MG PO TB12: ORAL_TABLET | ORAL | Status: DC

## 2014-11-11 NOTE — Progress Notes (Signed)
   Subjective:    Patient ID: Tiffany Perry, female    DOB: 06-30-1951, 64 y.o.   MRN: [id number]  HPI Patient arrives for a follow up on Wellbutrin. Patient states she is doing well and had recent work up thru [employer]. Workup for hematuria was negative Had recent bone density which showed osteoporosis she did not 1 start medicine 20 minutes spent with patient discussing osteoporosis and incontinence since approach  Review of Systems Denies any severe troubles denies shortness of breath chest pressure tightness pain fevers chills sweats    Objective:   Physical Exam Lungs clear heart regular pulse normal       Assessment & Plan:  Osteoporosis after long discussion patient agrees to go forward with Fosamax once a week along with calcium and vitamin D vitamin D 2000 IUs daily calcium 600 mg daily with a calcium enriched diet  Depression stable refills given follow-up in one year sooner problems

## 2014-11-18 ENCOUNTER — Telehealth (INDEPENDENT_AMBULATORY_CARE_PROVIDER_SITE_OTHER): Payer: Self-pay | Admitting: *Deleted

## 2014-11-18 DIAGNOSIS — Z1211 Encounter for screening for malignant neoplasm of colon: Secondary | ICD-10-CM

## 2014-11-18 NOTE — Telephone Encounter (Signed)
Patient needs trilyte 

## 2014-11-19 ENCOUNTER — Other Ambulatory Visit (INDEPENDENT_AMBULATORY_CARE_PROVIDER_SITE_OTHER): Payer: Self-pay | Admitting: *Deleted

## 2014-11-19 DIAGNOSIS — Z86010 Personal history of colonic polyps: Secondary | ICD-10-CM

## 2014-11-23 MED ORDER — PEG 3350-KCL-NA BICARB-NACL 420 G PO SOLR: 4000.0000 mL | Freq: Once | ORAL | Status: DC

## 2014-12-15 ENCOUNTER — Telehealth (INDEPENDENT_AMBULATORY_CARE_PROVIDER_SITE_OTHER): Payer: Self-pay | Admitting: *Deleted

## 2014-12-15 NOTE — Telephone Encounter (Signed)
Referring MD/PCP: scott luking   Procedure: tcs  Reason/Indication:  Hx polyps  Has patient had this procedure before?  Yes, 2013 -- epic  If so, when, by whom and where?    Is there a family history of colon cancer?  no  Who?  What age when diagnosed?    Is patient diabetic?   no      Does patient have prosthetic heart valve?  no  Do you have a pacemaker?  no  Has patient ever had endocarditis? no  Has patient had joint replacement within last 12 months?  no  Does patient tend to be constipated or take laxatives? no  Is patient on Coumadin, Plavix and/or Aspirin? no  Medications: alendronate 70 mg 1 tab weekly, alprazolam 0.5 mg bid prn, buproprion 150 mg bid, calcium 600 mg daily,   Allergies: codeine  Medication Adjustment:   Procedure date & time: 01/13/15 at 830

## 2014-12-16 NOTE — Telephone Encounter (Signed)
agree

## 2015-01-13 ENCOUNTER — Encounter (HOSPITAL_COMMUNITY): Payer: Self-pay | Admitting: *Deleted

## 2015-01-13 ENCOUNTER — Encounter (HOSPITAL_COMMUNITY)
Admission: RE | Disposition: A | Discharge status: Home / Self Care | Payer: Self-pay | Source: Ambulatory Visit | Attending: Internal Medicine

## 2015-01-13 ENCOUNTER — Ambulatory Visit (HOSPITAL_COMMUNITY)
Admission: RE | Admit: 2015-01-13 | Discharge: 2015-01-13 | Disposition: A | Discharge status: Home / Self Care | Payer: No Typology Code available for payment source | Source: Ambulatory Visit | Attending: Internal Medicine | Admitting: Internal Medicine

## 2015-01-13 DIAGNOSIS — M810 Age-related osteoporosis without current pathological fracture: Secondary | ICD-10-CM | POA: Insufficient documentation

## 2015-01-13 DIAGNOSIS — Z86010 Personal history of colonic polyps: Secondary | ICD-10-CM

## 2015-01-13 DIAGNOSIS — F411 Generalized anxiety disorder: Secondary | ICD-10-CM | POA: Diagnosis not present

## 2015-01-13 DIAGNOSIS — D125 Benign neoplasm of sigmoid colon: Secondary | ICD-10-CM | POA: Diagnosis not present

## 2015-01-13 DIAGNOSIS — Z800 Family history of malignant neoplasm of digestive organs: Secondary | ICD-10-CM | POA: Diagnosis not present

## 2015-01-13 DIAGNOSIS — K6389 Other specified diseases of intestine: Secondary | ICD-10-CM | POA: Insufficient documentation

## 2015-01-13 DIAGNOSIS — D122 Benign neoplasm of ascending colon: Secondary | ICD-10-CM | POA: Insufficient documentation

## 2015-01-13 DIAGNOSIS — F329 Major depressive disorder, single episode, unspecified: Secondary | ICD-10-CM | POA: Diagnosis not present

## 2015-01-13 DIAGNOSIS — Z09 Encounter for follow-up examination after completed treatment for conditions other than malignant neoplasm: Secondary | ICD-10-CM | POA: Diagnosis present

## 2015-01-13 DIAGNOSIS — K6289 Other specified diseases of anus and rectum: Secondary | ICD-10-CM | POA: Insufficient documentation

## 2015-01-13 DIAGNOSIS — Z79899 Other long term (current) drug therapy: Secondary | ICD-10-CM | POA: Insufficient documentation

## 2015-01-13 DIAGNOSIS — M1990 Unspecified osteoarthritis, unspecified site: Secondary | ICD-10-CM | POA: Diagnosis not present

## 2015-01-13 DIAGNOSIS — K648 Other hemorrhoids: Secondary | ICD-10-CM | POA: Diagnosis not present

## 2015-01-13 DIAGNOSIS — K644 Residual hemorrhoidal skin tags: Secondary | ICD-10-CM | POA: Diagnosis not present

## 2015-01-13 DIAGNOSIS — K5730 Diverticulosis of large intestine without perforation or abscess without bleeding: Secondary | ICD-10-CM | POA: Diagnosis not present

## 2015-01-13 HISTORY — DX: Age-related osteoporosis without current pathological fracture: M81.0

## 2015-01-13 HISTORY — PX: COLONOSCOPY: SHX5424

## 2015-01-13 SURGERY — COLONOSCOPY
Anesthesia: Moderate Sedation

## 2015-01-13 MED ORDER — MEPERIDINE HCL 50 MG/ML IJ SOLN
INTRAMUSCULAR | Status: DC | PRN
  Administered 2015-01-13: 15 mg via INTRAVENOUS
  Administered 2015-01-13: 25 mg via INTRAVENOUS

## 2015-01-13 MED ORDER — MIDAZOLAM HCL 5 MG/5ML IJ SOLN
INTRAMUSCULAR | Status: AC
  Filled 2015-01-13: qty 10

## 2015-01-13 MED ORDER — MIDAZOLAM HCL 5 MG/5ML IJ SOLN
INTRAMUSCULAR | Status: DC | PRN
  Administered 2015-01-13: 2 mg via INTRAVENOUS
  Administered 2015-01-13: 1 mg via INTRAVENOUS
  Administered 2015-01-13: 2 mg via INTRAVENOUS

## 2015-01-13 MED ORDER — SODIUM CHLORIDE 0.9 % IV SOLN: INTRAVENOUS | Status: DC

## 2015-01-13 MED ORDER — MEPERIDINE HCL 50 MG/ML IJ SOLN
INTRAMUSCULAR | Status: AC
  Filled 2015-01-13: qty 1

## 2015-01-13 MED ORDER — STERILE WATER FOR IRRIGATION IR SOLN
Status: DC | PRN
  Administered 2015-01-13: 09:00:00

## 2015-01-13 NOTE — Op Note (Signed)
COLONOSCOPY PROCEDURE REPORT  PATIENT:  Tiffany Perry  MR#:  [id number] Birthdate:  1950/08/13, 64 y.o., female Endoscopist:  Dr. Rogene Houston, MD Referred By:  Dr. Sallee Lange, MD Procedure Date: 01/13/2015  Procedure:   Colonoscopy  Indications: Patient is 64 year old Caucasian female was history of colonic polyps. She had 5 polyps removed on her last colonoscopy in April 2013. Cecal polyp was 17 mm in tubular adenoma. Three other polyps were also tubular adenomas and one polyp was sessile serrated polyp. She is returning for surveillance colonoscopy. History significant for CRC in first cousin.  Informed Consent:  The procedure and risks were reviewed with the patient and informed consent was obtained.  Medications:  Demerol 40 mg IV Versed 5 mg IV  Description of procedure:  After a digital rectal exam was performed, that colonoscope was advanced from the anus through the rectum and colon to the area of the cecum, ileocecal valve and appendiceal orifice. The cecum was deeply intubated. These structures were well-seen and photographed for the record. From the level of the cecum and ileocecal valve, the scope was slowly and cautiously withdrawn. The mucosal surfaces were carefully surveyed utilizing scope tip to flexion to facilitate fold flattening as needed. The scope was pulled down into the rectum where a thorough exam including retroflexion was performed.  Findings:  Prep excellent. Scar noted at cecum at previous polypectomy site without residual or recurrent polyp. 5 mm polyp cold snared from ascending colon. Single small diverticulum noted at distal sigmoid colon. Linear scarring at distal rectum from previous episiotomy. Small hemorrhoids below the dentate line and single anal papilla.   Therapeutic/Diagnostic Maneuvers Performed:  See above  Complications:  None  Cecal Withdrawal Time:  12 minutes  Impression:  Examination performed to cecum. No evidence of  residual or recurrent polyp at cecum. 5 mm polyp at ascending colon was cold snared. Single small diverticulum at sigmoid colon. Small external hemorrhoids and anal papilla.  Recommendations:  Standard instructions given. I will contact patient with biopsy results and further recommendations.  Antwaine Boomhower U  01/13/2015 9:14 AM  CC: Dr. Sallee Lange, MD & Dr. Rayne Du ref. provider found

## 2015-01-13 NOTE — H&P (Signed)
Tiffany Perry is an 64 y.o. female.   Chief Complaint: Patient is here for colonoscopy. HPI: She is 64 year old Caucasian female with history of colonic polyps and is here for surveillance colonoscopy. Her last exam was in April 2013 with removal of 17 mm cecal polyp which is tubular adenoma. She had 4 more polyps removed; 3 were tubular adenomas and fourth polyp was sessile serrated polyp. She denies abdominal pain change in her bowel habits or rectal bleeding. Family history significant for CRC in 1 first cousin.  Past Medical History  Diagnosis Date  . Depression   . Osteoarthritis   . GAD (generalized anxiety disorder)   . Osteoporosis     Past Surgical History  Procedure Laterality Date  . Foot surgery    . Dilation and curettage of uterus    . Colonoscopy  11/16/2011    Procedure: COLONOSCOPY;  Surgeon: Rogene Houston, MD;  Location: [facility];  Service: Endoscopy;  Laterality: N/A;  730    Family History  Problem Relation Age of Onset  . Hypertension Father   . Heart attack Father   . Colon cancer Cousin 26   Social History:  reports that she has never smoked. She does not have any smokeless tobacco history on file. She reports that she does not drink alcohol or use illicit drugs.  Allergies:  Allergies  Allergen Reactions  . Codeine Nausea And Vomiting    Medications Prior to Admission  Medication Sig Dispense Refill  . alendronate (FOSAMAX) 70 MG tablet Take 1 tablet (70 mg total) by mouth every 7 (seven) days. Take with a full glass of water on an empty stomach. 4 tablet 11  . buPROPion (WELLBUTRIN SR) 150 MG 12 hr tablet TAKE (1) TABLET TWICE DAILY. 60 tablet 12  . Calcium-Magnesium-Vitamin D (CALCIUM 500 PO) Take 1 tablet by mouth daily.    . Cholecalciferol (VITAMIN D) 2000 UNITS CAPS Take 1 capsule by mouth daily.    . polyethylene glycol-electrolytes (NULYTELY/GOLYTELY) 420 G solution Take 4,000 mLs by mouth once. 4000 mL 0  . ALPRAZolam (XANAX) 0.5 MG  tablet Take 0.5-1 tablets (0.25-0.5 mg total) by mouth 2 (two) times daily as needed. For anxiety 40 tablet 0    No results found for this or any previous visit (from the past 48 hour(s)). No results found.  ROS  Blood pressure 111/71, pulse 67, temperature 97.8 F (36.6 C), temperature source Oral, resp. rate 13, height 5\' 3"  (1.6 m), weight 130 lb (58.968 kg), SpO2 99 %. Physical Exam  Constitutional:  Well-developed thin Caucasian female in NAD.  HENT:  Mouth/Throat: Oropharynx is clear and moist.  Eyes: Conjunctivae are normal. No scleral icterus.  Neck: No thyromegaly present.  Cardiovascular: Normal rate, regular rhythm and normal heart sounds.   No murmur heard. Respiratory: Effort normal and breath sounds normal.  GI: Soft. She exhibits no distension and no mass. There is no tenderness.  Musculoskeletal: She exhibits no edema.  Lymphadenopathy:    She has no cervical adenopathy.  Neurological: She is alert.  Skin: Skin is warm and dry.     Assessment/Plan History of colonic adenomas. Surveillance colonoscopy.  REHMAN,NAJEEB U 01/13/2015, 8:34 AM

## 2015-01-13 NOTE — Discharge Instructions (Signed)
Resume usual medications and diet. No driving for 24 hours. Physician will call with biopsy results.      Colonoscopy, Care After These instructions give you information on caring for yourself after your procedure. Your doctor may also give you more specific instructions. Call your doctor if you have any problems or questions after your procedure. HOME CARE  Do not drive for 24 hours.  Do not sign important papers or use machinery for 24 hours.  You may shower.  You may go back to your usual activities, but go slower for the first 24 hours.  Take rest breaks often during the first 24 hours.  Walk around or use warm packs on your belly (abdomen) if you have belly cramping or gas.  Drink enough fluids to keep your pee (urine) clear or pale yellow.  Resume your normal diet. Avoid heavy or fried foods.  Avoid drinking alcohol for 24 hours or as told by your doctor.  Only take medicines as told by your doctor. If a tissue sample (biopsy) was taken during the procedure:   Do not take aspirin or blood thinners for 7 days, or as told by your doctor.  Do not drink alcohol for 7 days, or as told by your doctor.  Eat soft foods for the first 24 hours. GET HELP IF: You still have a small amount of blood in your poop (stool) 2-3 days after the procedure. GET HELP RIGHT AWAY IF:  You have more than a small amount of blood in your poop.  You see clumps of tissue (blood clots) in your poop.  Your belly is puffy (swollen).  You feel sick to your stomach (nauseous) or throw up (vomit).  You have a fever.  You have belly pain that gets worse and medicine does not help. MAKE SURE YOU:  Understand these instructions.  Will watch your condition.  Will get help right away if you are not doing well or get worse.   Diverticulosis Diverticulosis is the condition that develops when small pouches (diverticula) form in the wall of your colon. Your colon, or large intestine, is where  water is absorbed and stool is formed. The pouches form when the inside layer of your colon pushes through weak spots in the outer layers of your colon. CAUSES  No one knows exactly what causes diverticulosis. RISK FACTORS  Being older than 83. Your risk for this condition increases with age. Diverticulosis is rare in people younger than 40 years. By age 40, almost everyone has it.  Eating a low-fiber diet.  Being frequently constipated.  Being overweight.  Not getting enough exercise.  Smoking.  Taking over-the-counter pain medicines, like aspirin and ibuprofen. SYMPTOMS  Most people with diverticulosis do not have symptoms. DIAGNOSIS  Because diverticulosis often has no symptoms, health care providers often discover the condition during an exam for other colon problems. In many cases, a health care provider will diagnose diverticulosis while using a flexible scope to examine the colon (colonoscopy). TREATMENT  If you have never developed an infection related to diverticulosis, you may not need treatment. If you have had an infection before, treatment may include:  Eating more fruits, vegetables, and grains.  Taking a fiber supplement.  Taking a live bacteria supplement (probiotic).  Taking medicine to relax your colon. HOME CARE INSTRUCTIONS   Drink at least 6-8 glasses of water each day to prevent constipation.  Try not to strain when you have a bowel movement.  Keep all follow-up appointments. If you  have had an infection before:  Increase the fiber in your diet as directed by your health care provider or dietitian.  Take a dietary fiber supplement if your health care provider approves.  Only take medicines as directed by your health care provider. SEEK MEDICAL CARE IF:   You have abdominal pain.  You have bloating.  You have cramps.  You have not gone to the bathroom in 3 days. SEEK IMMEDIATE MEDICAL CARE IF:   Your pain gets worse.  Yourbloating  becomes very bad.  You have a fever or chills, and your symptoms suddenly get worse.  You begin vomiting.  You have bowel movements that are bloody or black. MAKE SURE YOU:  Understand these instructions.  Will watch your condition.  Will get help right away if you are not doing well or get worse.   Colon Polyps Polyps are lumps of extra tissue growing inside the body. Polyps can grow in the large intestine (colon). Most colon polyps are noncancerous (benign). However, some colon polyps can become cancerous over time. Polyps that are larger than a pea may be harmful. To be safe, caregivers remove and test all polyps. CAUSES  Polyps form when mutations in the genes cause your cells to grow and divide even though no more tissue is needed. RISK FACTORS There are a number of risk factors that can increase your chances of getting colon polyps. They include:  Being older than 50 years.  Family history of colon polyps or colon cancer.  Long-term colon diseases, such as colitis or Crohn disease.  Being overweight.  Smoking.  Being inactive.  Drinking too much alcohol. SYMPTOMS  Most small polyps do not cause symptoms. If symptoms are present, they may include:  Blood in the stool. The stool may look dark red or black.  Constipation or diarrhea that lasts longer than 1 week. DIAGNOSIS People often do not know they have polyps until their caregiver finds them during a regular checkup. Your caregiver can use 4 tests to check for polyps:  Digital rectal exam. The caregiver wears gloves and feels inside the rectum. This test would find polyps only in the rectum.  Barium enema. The caregiver puts a liquid called barium into your rectum before taking X-rays of your colon. Barium makes your colon look white. Polyps are dark, so they are easy to see in the X-ray pictures.  Sigmoidoscopy. A thin, flexible tube (sigmoidoscope) is placed into your rectum. The sigmoidoscope has a light  and tiny camera in it. The caregiver uses the sigmoidoscope to look at the last third of your colon.  Colonoscopy. This test is like sigmoidoscopy, but the caregiver looks at the entire colon. This is the most common method for finding and removing polyps. TREATMENT  Any polyps will be removed during a sigmoidoscopy or colonoscopy. The polyps are then tested for cancer. PREVENTION  To help lower your risk of getting more colon polyps:  Eat plenty of fruits and vegetables. Avoid eating fatty foods.  Do not smoke.  Avoid drinking alcohol.  Exercise every day.  Lose weight if recommended by your caregiver.  Eat plenty of calcium and folate. Foods that are rich in calcium include milk, cheese, and broccoli. Foods that are rich in folate include chickpeas, kidney beans, and spinach. HOME CARE INSTRUCTIONS Keep all follow-up appointments as directed by your caregiver. You may need periodic exams to check for polyps. SEEK MEDICAL CARE IF: You notice bleeding during a bowel movement.

## 2015-01-14 ENCOUNTER — Encounter (HOSPITAL_COMMUNITY): Payer: Self-pay | Admitting: Internal Medicine

## 2015-04-12 ENCOUNTER — Telehealth: Payer: Self-pay | Admitting: Family Medicine

## 2015-04-12 NOTE — Telephone Encounter (Signed)
Patient said she was prescribed Fosamax back in late Spring and she started having issues with it, stopped taking it, started again, and stopped currently.  She said that she started to have an unusual pain in her leg one night and woke up and she had a pain in her shoulder.  She also started having vomiting sensations in her throat when she would eat sometimes as she burped.  She is unsure, but believes this was acid reflux.  She said she does not know if this is all related to taking the Fosamax. She wants to know Dr. Bary Leriche opinion on this and if she should start taking it again.  [place]

## 2015-04-14 NOTE — Telephone Encounter (Signed)
Please let the patient know that I reviewed over her record. I reviewed over her bone density. She would benefit from being on Fosamax. This medication can cause bone and joint discomforts that can come and go. I would recommend that she try the medicine for at least an additional month then follow-up with [place] in 3-4 weeks so we can discuss how it is going. Certainly if the medication is causing severe reflux issues or burning in the chest or epigastrium then I would recommend to stop the medication-in that situation we would go toward a IV medication. As for the risk of atypical fractures with osteoporosis medicines that typically does not show increased risk for this until taking the medicine for 4 to 5 years or more. Please see if the patient is willing to restart medicine then follow-up with [place] in 3-4 weeks

## 2015-04-14 NOTE — Telephone Encounter (Signed)
Left message to return call 

## 2015-04-14 NOTE — Telephone Encounter (Signed)
Discussed with patient. Patient advised that Dr Nicki Reaper reviewed over her record and over her bone density. She would benefit from being on Fosamax. This medication can cause bone and joint discomforts that can come and go. Dr Nicki Reaper would recommend that she try the medicine for at least an additional month then follow-up with [place] in 3-4 weeks so we can discuss how it is going. Certainly if the medication is causing severe reflux issues or burning in the chest or epigastrium then Dr Nicki Reaper would recommend to stop the medication-in that situation we would go toward a IV medication. As for the risk of atypical fractures with osteoporosis medicines that typically does not show increased risk for this until taking the medicine for 4 to 5 years or more. Please see if the patient is willing to restart medicine then follow-up with [place] in 3-4 weeks. Patient verbalized understanding and is willing to restart meds and states she already has refills at pharmacy.

## 2015-05-10 ENCOUNTER — Telehealth: Payer: Self-pay | Admitting: Family Medicine

## 2015-05-10 MED ORDER — ALPRAZOLAM 0.5 MG PO TABS: 0.2500 mg | ORAL_TABLET | Freq: Two times a day (BID) | ORAL | Status: DC | PRN

## 2015-05-10 NOTE — Telephone Encounter (Signed)
Script will be faxed to pharmacy. Patient was notified.

## 2015-05-10 NOTE — Telephone Encounter (Signed)
It would be fine to refill medication 1

## 2015-05-10 NOTE — Telephone Encounter (Signed)
Xanax refill please, pt has not had this med since Dec 2014 she states  Pt feels anxiety from the hectic/chaos going on with her sons getting  Married in a few weeks in [place]. She would like to have some on hand Just for insurance. No panic attacks at this point, but trying to be cautious  [facility]

## 2015-05-28 ENCOUNTER — Other Ambulatory Visit: Payer: Self-pay

## 2015-07-23 ENCOUNTER — Other Ambulatory Visit: Payer: Self-pay | Admitting: Obstetrics and Gynecology

## 2015-07-28 ENCOUNTER — Other Ambulatory Visit: Payer: Self-pay | Admitting: Obstetrics and Gynecology

## 2015-09-28 ENCOUNTER — Encounter: Payer: Self-pay | Admitting: Family Medicine

## 2015-09-28 ENCOUNTER — Ambulatory Visit (INDEPENDENT_AMBULATORY_CARE_PROVIDER_SITE_OTHER): Payer: PRIVATE HEALTH INSURANCE | Admitting: Family Medicine

## 2015-09-28 VITALS — BP 104/66 | Temp 98.9°F | Wt 123.0 lb

## 2015-09-28 DIAGNOSIS — J029 Acute pharyngitis, unspecified: Secondary | ICD-10-CM

## 2015-09-28 DIAGNOSIS — J111 Influenza due to unidentified influenza virus with other respiratory manifestations: Secondary | ICD-10-CM

## 2015-09-28 LAB — POCT RAPID STREP A (OFFICE): Rapid Strep A Screen: NEGATIVE

## 2015-09-28 MED ORDER — OSELTAMIVIR PHOSPHATE 75 MG PO CAPS: 75.0000 mg | ORAL_CAPSULE | Freq: Two times a day (BID) | ORAL | Status: DC

## 2015-09-28 NOTE — Patient Instructions (Signed)

## 2015-09-28 NOTE — Progress Notes (Signed)
   Subjective:    Patient ID: Tiffany Perry, female    DOB: 04/28/1951, 65 y.o.   MRN: [id number]  Sore Throat  This is a new problem. The current episode started today. Associated symptoms include congestion and coughing. Pertinent negatives include no ear pain or shortness of breath. Associated symptoms comments: Fever, headache, chills, nose stopped up, yellow drainage, noise in right ear earlier today. .   Symptoms onset over the past 36 hours start off sore throat and body aches low-grade fever cough body aches headache not feeling good   Review of Systems  Constitutional: Negative for fever and activity change.  HENT: Positive for congestion and rhinorrhea. Negative for ear pain.   Eyes: Negative for discharge.  Respiratory: Positive for cough. Negative for shortness of breath and wheezing.   Cardiovascular: Negative for chest pain.   Rapid strep test done to rule out strep throat this was negative    Objective:   Physical Exam  Constitutional: She appears well-developed.  HENT:  Head: Normocephalic.  Nose: Nose normal.  Mouth/Throat: Oropharynx is clear and moist. No oropharyngeal exudate.  Neck: Neck supple.  Cardiovascular: Normal rate and normal heart sounds.   No murmur heard. Pulmonary/Chest: Effort normal and breath sounds normal. She has no wheezes.  Lymphadenopathy:    She has no cervical adenopathy.  Skin: Skin is warm and dry.  Nursing note and vitals reviewed.         Assessment & Plan:  Influenza-the patient was diagnosed with influenza. Patient/family educated about the flu and warning signs to watch for. If difficulty breathing, severe neck pain and stiffness, cyanosis, disorientation, or progressive worsening then immediately get rechecked at that ER. If progressive symptoms be certain to be rechecked. Supportive measures such as Tylenol/ibuprofen was discussed. No aspirin use in children. And influenza home care instruction sheet was given. Tamiflu  recommended prescribed patient not toxic no need for antibiotics

## 2015-11-15 ENCOUNTER — Other Ambulatory Visit: Payer: Self-pay | Admitting: Family Medicine

## 2015-11-15 NOTE — Telephone Encounter (Signed)
May have a prescription refill +2 additional refill-patient needs medication office visit for this spring

## 2015-11-15 NOTE — Telephone Encounter (Signed)
May we refill Wellbutrin?

## 2015-12-01 ENCOUNTER — Encounter: Payer: PRIVATE HEALTH INSURANCE | Admitting: Family Medicine

## 2015-12-06 ENCOUNTER — Ambulatory Visit (INDEPENDENT_AMBULATORY_CARE_PROVIDER_SITE_OTHER): Payer: PRIVATE HEALTH INSURANCE | Admitting: Family Medicine

## 2015-12-06 ENCOUNTER — Encounter: Payer: Self-pay | Admitting: Family Medicine

## 2015-12-06 ENCOUNTER — Ambulatory Visit (HOSPITAL_COMMUNITY)
Admission: RE | Admit: 2015-12-06 | Discharge: 2015-12-06 | Disposition: A | Discharge status: Home / Self Care | Payer: No Typology Code available for payment source | Source: Ambulatory Visit | Attending: Family Medicine | Admitting: Family Medicine

## 2015-12-06 VITALS — BP 132/76 | Ht 63.0 in | Wt 126.0 lb

## 2015-12-06 DIAGNOSIS — M19041 Primary osteoarthritis, right hand: Secondary | ICD-10-CM | POA: Diagnosis not present

## 2015-12-06 DIAGNOSIS — M19042 Primary osteoarthritis, left hand: Secondary | ICD-10-CM

## 2015-12-06 DIAGNOSIS — M1990 Unspecified osteoarthritis, unspecified site: Secondary | ICD-10-CM

## 2015-12-06 NOTE — Progress Notes (Signed)
   Subjective:    Patient ID: Tiffany Perry, female    DOB: April 08, 1951, 65 y.o.   MRN: [id number]  HPI Patient would like to discuss joint pain to hands and knee. States no other concerns.   Patient relates knee pain hand pain stiffness discomfort wonders what she should do she is not taking any medications currently. She denies any other underlying issues. Patient gets annual wellness at GYN office. PMH benign  Review of Systems See above pain is worse in the morning hours. But present throughout the day. Soreness stiffness in the hands denies numbness or tingling.    Objective:   Physical Exam  Severe osteoarthritis of both hands. In addition to this lungs clear heart regular crepitus noted in both knees.      Assessment & Plan:  Significant arthralgias she has advancement of osteoarthritis in her hands I recommend she tried naproxen 220 mg one or 2 in the morning one to 2 in the evening help her with pain in addition to this I advised the patient to do lab work and x-ray if erosive arthritis or abnormal lab work Therapist, occupational patient rheumatology

## 2015-12-07 LAB — C-REACTIVE PROTEIN: CRP: 0.9 mg/L (ref 0.0–4.9)

## 2015-12-07 LAB — RHEUMATOID FACTOR: Rhuematoid fact SerPl-aCnc: 11 IU/mL (ref 0.0–13.9)

## 2015-12-07 LAB — SEDIMENTATION RATE: Sed Rate: 2 mm/hr (ref 0–40)

## 2015-12-08 NOTE — Addendum Note (Signed)
Addended by: Dairl Ponder on: 12/08/2015 02:15 PM   Modules accepted: Orders

## 2015-12-10 ENCOUNTER — Encounter: Payer: Self-pay | Admitting: Family Medicine

## 2015-12-14 ENCOUNTER — Encounter: Payer: Self-pay | Admitting: Family Medicine

## 2015-12-14 ENCOUNTER — Telehealth: Payer: Self-pay | Admitting: Family Medicine

## 2015-12-14 NOTE — Telephone Encounter (Signed)
Please let the patient know that I feel good about Dr.Syed who is a good rheumatologist. Apparently Dr. Amil Amen is not in her network. I would recommend that she stay within her network and Dr.Syed can certainly give good care

## 2015-12-14 NOTE — Telephone Encounter (Signed)
Per [place], they are not in the same group.

## 2015-12-14 NOTE — Telephone Encounter (Signed)
Discussed with pt

## 2015-12-14 NOTE — Telephone Encounter (Signed)
Patient had a referral set up to go to Dr. Melissa Noon office because that is who Dr. Nicki Reaper recommended.  Unfortunately, because he was not in her network, patient was referred to Dr. Dossie Der.  Patient wants to know how Dr. Nicki Reaper feels about her seeing Dr. Dossie Der and if he is in the same group of Dr. Amil Amen.

## 2015-12-16 ENCOUNTER — Other Ambulatory Visit: Payer: Self-pay | Admitting: Family Medicine

## 2015-12-16 NOTE — Telephone Encounter (Signed)
May we refill Wellbutrin?

## 2015-12-16 NOTE — Telephone Encounter (Signed)
Refill times 11

## 2016-01-17 ENCOUNTER — Other Ambulatory Visit: Payer: Self-pay | Admitting: Family Medicine

## 2016-02-10 ENCOUNTER — Other Ambulatory Visit: Payer: Self-pay | Admitting: Family Medicine

## 2016-02-11 ENCOUNTER — Other Ambulatory Visit: Payer: Self-pay | Admitting: Family Medicine

## 2016-03-14 ENCOUNTER — Other Ambulatory Visit: Payer: Self-pay | Admitting: Family Medicine

## 2016-03-28 ENCOUNTER — Ambulatory Visit (INDEPENDENT_AMBULATORY_CARE_PROVIDER_SITE_OTHER): Payer: PRIVATE HEALTH INSURANCE | Admitting: Family Medicine

## 2016-03-28 ENCOUNTER — Encounter: Payer: Self-pay | Admitting: Family Medicine

## 2016-03-28 VITALS — BP 130/78 | Temp 98.0°F | Ht 63.0 in | Wt 131.1 lb

## 2016-03-28 DIAGNOSIS — B9689 Other specified bacterial agents as the cause of diseases classified elsewhere: Secondary | ICD-10-CM

## 2016-03-28 DIAGNOSIS — J209 Acute bronchitis, unspecified: Secondary | ICD-10-CM

## 2016-03-28 DIAGNOSIS — J019 Acute sinusitis, unspecified: Secondary | ICD-10-CM

## 2016-03-28 MED ORDER — AZITHROMYCIN 250 MG PO TABS: ORAL_TABLET | ORAL | 0 refills | Status: DC

## 2016-03-28 MED ORDER — ALENDRONATE SODIUM 70 MG PO TABS: ORAL_TABLET | ORAL | 6 refills | Status: DC

## 2016-03-28 NOTE — Progress Notes (Signed)
   Subjective:    Patient ID: Tiffany Perry, female    DOB: [birth year], 65 y.o.   MRN: [id number]  Cough  This is a new problem. The problem occurs every few minutes. The cough is non-productive. Associated symptoms include ear pain, headaches, rhinorrhea and shortness of breath. Pertinent negatives include no chest pain, fever or wheezing. Associated symptoms comments: Neck pain. Treatments tried: [place]    Patient states no other concerns this visit. Started off few weeks ago with initially head congestion then chest congestion denies wheezing denies shortness of breath relates low energy denies high fever chills Review of Systems  Constitutional: Negative for activity change and fever.  HENT: Positive for ear pain and rhinorrhea. Negative for congestion.   Eyes: Negative for discharge.  Respiratory: Positive for cough and shortness of breath. Negative for wheezing.   Cardiovascular: Negative for chest pain.  Neurological: Positive for headaches.       Objective:   Physical Exam  Constitutional: She appears well-developed.  HENT:  Head: Normocephalic.  Nose: Nose normal.  Mouth/Throat: Oropharynx is clear and moist. No oropharyngeal exudate.  Neck: Neck supple.  Cardiovascular: Normal rate and normal heart sounds.   No murmur heard. Pulmonary/Chest: Effort normal and breath sounds normal. She has no wheezes.  Lymphadenopathy:    She has no cervical adenopathy.  Skin: Skin is warm and dry.  Nursing note and vitals reviewed.         Assessment & Plan:  Patient was seen today for upper respiratory illness. It is felt that the patient is dealing with sinusitis. Antibiotics were prescribed today. Importance of compliance with medication was discussed. Symptoms should gradually resolve over the course of the next several days. If high fevers, progressive illness, difficulty breathing, worsening condition or failure for symptoms to improve over the next several days then the  patient is to follow-up. If any emergent conditions the patient is to follow-up in the emergency department otherwise to follow-up in the office. I believe there is also allergy component to this  Bronchitis also related into this but since it's been going on for 3 weeks go ahead with the treatment of antibiotics  If the cough and chest congestion does not go away within the next 2-3 weeks patient is to notify us we will do a chest x-ray  If she gets worse such as fever wheezing difficulty breathing she will let us know  Patient will be do bone density testing later this year

## 2016-03-29 NOTE — Progress Notes (Signed)
Placed in [place] file for November for a bone density to be ordered for reason osteoporosis

## 2016-04-04 ENCOUNTER — Ambulatory Visit: Payer: PRIVATE HEALTH INSURANCE | Admitting: Family Medicine

## 2016-07-05 ENCOUNTER — Telehealth: Payer: Self-pay | Admitting: *Deleted

## 2016-07-05 DIAGNOSIS — Z780 Asymptomatic menopausal state: Secondary | ICD-10-CM

## 2016-07-05 DIAGNOSIS — M810 Age-related osteoporosis without current pathological fracture: Secondary | ICD-10-CM

## 2016-07-05 NOTE — Telephone Encounter (Signed)
-----   Message from Dairl Ponder, RN sent at 07/05/2016 10:54 AM EST ----- Regarding: tickler file Bone Density dx:  osteoporosis

## 2016-07-06 ENCOUNTER — Telehealth: Payer: Self-pay

## 2016-07-06 NOTE — Telephone Encounter (Signed)
-----   Message from Dairl Ponder, RN sent at 07/05/2016 10:54 AM EST ----- Regarding: tickler file Bone Density dx:  osteoporosis

## 2016-07-06 NOTE — Telephone Encounter (Signed)
Bone density scheduled for 07/10/16 at 4 pm. Register at 3:30 pm. Patient was notified and verbalized understanding.

## 2016-07-10 ENCOUNTER — Ambulatory Visit (HOSPITAL_COMMUNITY)
Admission: RE | Admit: 2016-07-10 | Discharge: 2016-07-10 | Disposition: A | Discharge status: Home / Self Care | Payer: No Typology Code available for payment source | Source: Ambulatory Visit | Attending: Family Medicine | Admitting: Family Medicine

## 2016-07-10 DIAGNOSIS — Z780 Asymptomatic menopausal state: Secondary | ICD-10-CM | POA: Insufficient documentation

## 2016-07-10 DIAGNOSIS — M810 Age-related osteoporosis without current pathological fracture: Secondary | ICD-10-CM | POA: Diagnosis present

## 2016-07-10 DIAGNOSIS — M8588 Other specified disorders of bone density and structure, other site: Secondary | ICD-10-CM | POA: Insufficient documentation

## 2016-08-09 ENCOUNTER — Ambulatory Visit: Payer: PRIVATE HEALTH INSURANCE | Admitting: Family Medicine

## 2016-08-10 ENCOUNTER — Ambulatory Visit (INDEPENDENT_AMBULATORY_CARE_PROVIDER_SITE_OTHER): Payer: PRIVATE HEALTH INSURANCE | Admitting: Family Medicine

## 2016-08-10 ENCOUNTER — Encounter: Payer: Self-pay | Admitting: Family Medicine

## 2016-08-10 VITALS — BP 120/72 | Ht 63.0 in | Wt 132.2 lb

## 2016-08-10 DIAGNOSIS — M8580 Other specified disorders of bone density and structure, unspecified site: Secondary | ICD-10-CM

## 2016-08-10 DIAGNOSIS — R5383 Other fatigue: Secondary | ICD-10-CM | POA: Diagnosis not present

## 2016-08-10 DIAGNOSIS — Z13220 Encounter for screening for lipoid disorders: Secondary | ICD-10-CM

## 2016-08-10 DIAGNOSIS — Z79899 Other long term (current) drug therapy: Secondary | ICD-10-CM | POA: Diagnosis not present

## 2016-08-10 MED ORDER — ALPRAZOLAM 0.5 MG PO TABS: 0.5000 mg | ORAL_TABLET | Freq: Two times a day (BID) | ORAL | 2 refills | Status: DC | PRN

## 2016-08-10 NOTE — Progress Notes (Signed)
   Subjective:    Patient ID: Tiffany Perry, female    DOB: Aug 24, 1950, 66 y.o.   MRN: [id number]  HPI Patient is here today to go over the results of her recent bone density test. Patient has no new concerns at this time.  Patient denies any type of the esophageal symptoms. Denies fever chills sweats. States takes Fosamax without difficulty. No chest pressure tightness.  Review of Systems    please see above. Objective:   Physical Exam Lungs are clear hearts regular HEENT is benign blood pressure good   Long discussion held with the patient regarding risk and benefits of biphosphonate's. Given that her osteoporosis is now osteopenia it is reasonable to give a drug holiday and recheck bone density in 2 years. It is also reasonable in order to reduce her risk of osteonecrosis of the jaw as well as atypical femur fractures    Assessment & Plan:  Patient will be coming for female checkups with [facility]  Patient suffering with some fatigue tiredness we'll check CBC thyroid more likely is related to age and activity  Patient previously with osteoporosis now has osteopenia. Stop Fosamax. Recheck bone density in 2 years time

## 2016-08-31 ENCOUNTER — Encounter: Payer: Self-pay | Admitting: Family Medicine

## 2016-08-31 LAB — BASIC METABOLIC PANEL
BUN/Creatinine Ratio: 16 (ref 12–28)
BUN: 13 mg/dL (ref 8–27)
CALCIUM: 9.3 mg/dL (ref 8.7–10.3)
CHLORIDE: 101 mmol/L (ref 96–106)
CO2: 26 mmol/L (ref 18–29)
Creatinine, Ser: 0.83 mg/dL (ref 0.57–1.00)
GFR calc Af Amer: 86 mL/min/{1.73_m2} (ref >59)
GFR, EST NON AFRICAN AMERICAN: 75 mL/min/{1.73_m2} (ref >59)
Glucose: 82 mg/dL (ref 65–99)
Potassium: 4.4 mmol/L (ref 3.5–5.2)
Sodium: 141 mmol/L (ref 134–144)

## 2016-08-31 LAB — CBC WITH DIFFERENTIAL/PLATELET
Basophils Absolute: 0.1 10*3/uL (ref 0.0–0.2)
Basos: 1 %
EOS (ABSOLUTE): 0.3 10*3/uL (ref 0.0–0.4)
Eos: 5 %
HEMATOCRIT: 43.5 % (ref 34.0–46.6)
Hemoglobin: 14.7 g/dL (ref 11.1–15.9)
Immature Grans (Abs): 0 10*3/uL (ref 0.0–0.1)
Immature Granulocytes: 0 %
LYMPHS: 43 %
Lymphocytes Absolute: 2.4 10*3/uL (ref 0.7–3.1)
MCH: 30.8 pg (ref 26.6–33.0)
MCHC: 33.8 g/dL (ref 31.5–35.7)
MCV: 91 fL (ref 79–97)
Monocytes Absolute: 0.5 10*3/uL (ref 0.1–0.9)
Monocytes: 9 %
NEUTROS PCT: 42 %
Neutrophils Absolute: 2.4 10*3/uL (ref 1.4–7.0)
Platelets: 280 10*3/uL (ref 150–379)
RBC: 4.77 x10E6/uL (ref 3.77–5.28)
RDW: 13.1 % (ref 12.3–15.4)
WBC: 5.6 10*3/uL (ref 3.4–10.8)

## 2016-08-31 LAB — LIPID PANEL
Chol/HDL Ratio: 2.4 ratio units (ref 0.0–4.4)
Cholesterol, Total: 201 mg/dL — ABNORMAL HIGH (ref 100–199)
HDL: 85 mg/dL (ref >39)
LDL Calculated: 103 mg/dL — ABNORMAL HIGH (ref 0–99)
Triglycerides: 65 mg/dL (ref 0–149)
VLDL Cholesterol Cal: 13 mg/dL (ref 5–40)

## 2016-08-31 LAB — TSH: TSH: 2.22 u[IU]/mL (ref 0.450–4.500)

## 2016-10-04 ENCOUNTER — Other Ambulatory Visit: Payer: Self-pay | Admitting: Obstetrics and Gynecology

## 2016-10-04 DIAGNOSIS — Z1231 Encounter for screening mammogram for malignant neoplasm of breast: Secondary | ICD-10-CM

## 2016-10-24 ENCOUNTER — Ambulatory Visit
Admission: RE | Admit: 2016-10-24 | Discharge: 2016-10-24 | Disposition: A | Discharge status: Home / Self Care | Payer: No Typology Code available for payment source | Source: Ambulatory Visit | Attending: Obstetrics and Gynecology | Admitting: Obstetrics and Gynecology

## 2016-10-24 DIAGNOSIS — Z1231 Encounter for screening mammogram for malignant neoplasm of breast: Secondary | ICD-10-CM

## 2016-10-26 ENCOUNTER — Other Ambulatory Visit: Payer: Self-pay | Admitting: Obstetrics and Gynecology

## 2016-10-26 DIAGNOSIS — R928 Other abnormal and inconclusive findings on diagnostic imaging of breast: Secondary | ICD-10-CM

## 2016-11-01 ENCOUNTER — Ambulatory Visit
Admission: RE | Admit: 2016-11-01 | Discharge: 2016-11-01 | Disposition: A | Discharge status: Home / Self Care | Payer: No Typology Code available for payment source | Source: Ambulatory Visit | Attending: Obstetrics and Gynecology | Admitting: Obstetrics and Gynecology

## 2016-11-01 DIAGNOSIS — R928 Other abnormal and inconclusive findings on diagnostic imaging of breast: Secondary | ICD-10-CM

## 2016-12-23 ENCOUNTER — Other Ambulatory Visit: Payer: Self-pay | Admitting: Family Medicine

## 2016-12-26 NOTE — Telephone Encounter (Signed)
Last seen 08/07/2016

## 2017-01-22 ENCOUNTER — Other Ambulatory Visit: Payer: Self-pay | Admitting: Family Medicine

## 2017-01-22 NOTE — Telephone Encounter (Signed)
Last seen 08/10/16

## 2017-01-22 NOTE — Telephone Encounter (Signed)
May have this and 3 refills needs follow-up by the fall

## 2017-02-22 ENCOUNTER — Other Ambulatory Visit: Payer: Self-pay | Admitting: Family Medicine

## 2017-02-22 NOTE — Telephone Encounter (Signed)
May have this +5 refills 

## 2017-05-09 IMAGING — MG 2D DIGITAL SCREENING BILATERAL MAMMOGRAM WITH CAD AND ADJUNCT TO
9 of 12 series · 9 of 28 positions shown · non-contrast
Comparison: Previous exam(s).

CLINICAL DATA: Screening.

EXAM:
2D DIGITAL SCREENING BILATERAL MAMMOGRAM WITH CAD AND ADJUNCT TOMO

[L CC]
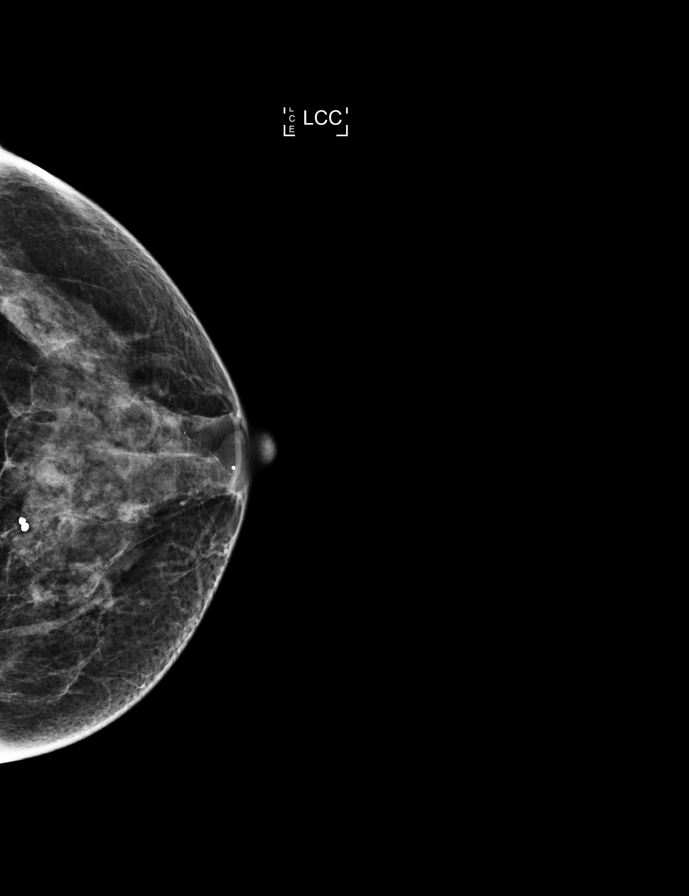

[L MLO synth-2D]
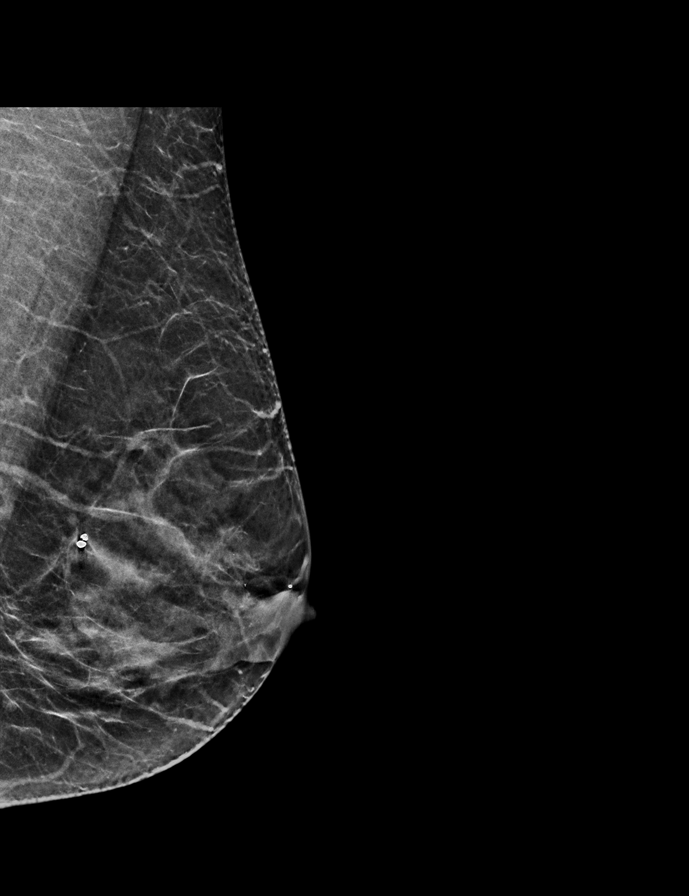

[L MLO]
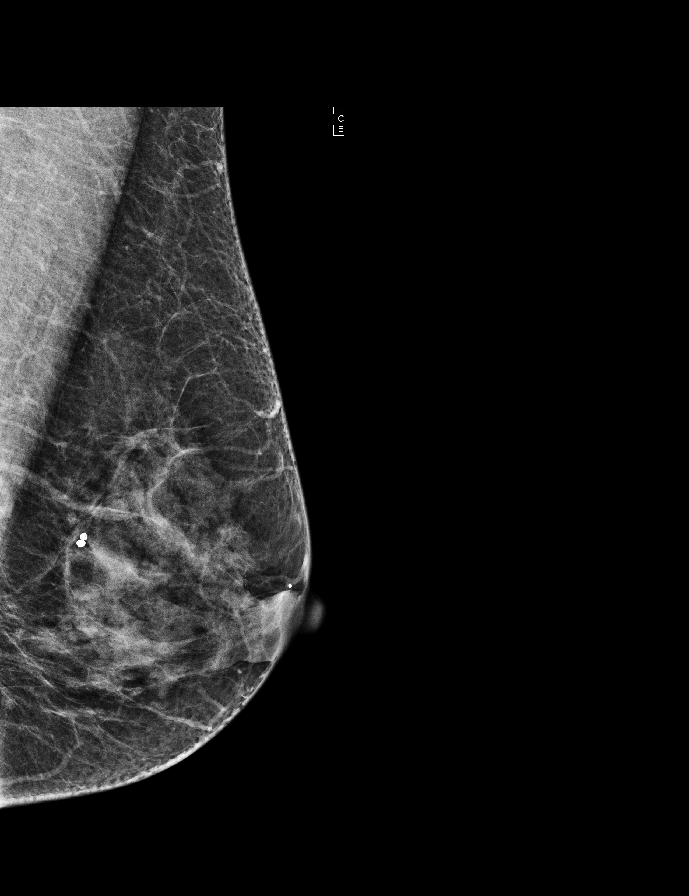

[R CC]
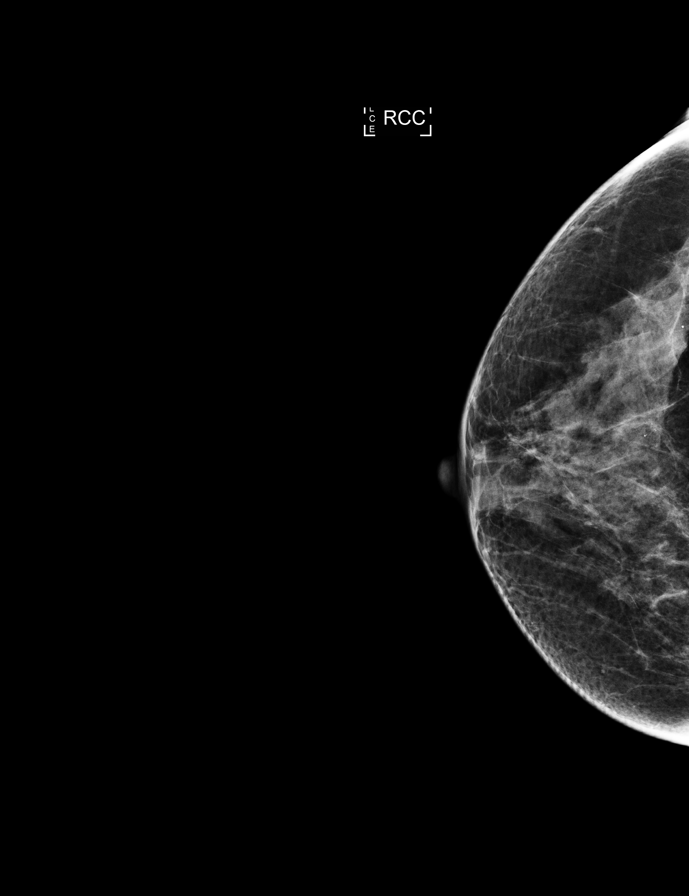

[R CC synth-2D]
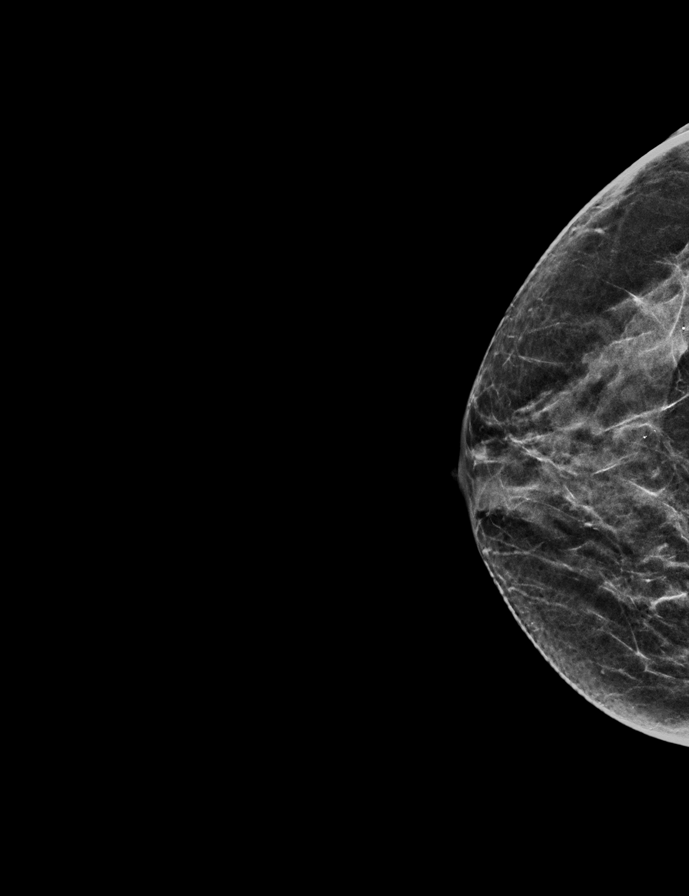

[L CC synth-2D]
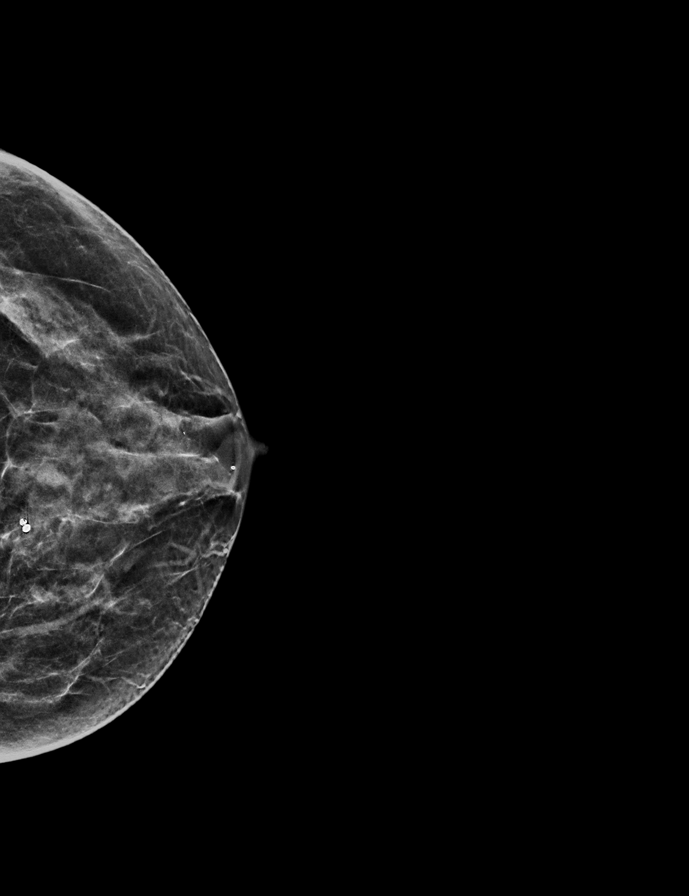

[R MLO synth-2D]
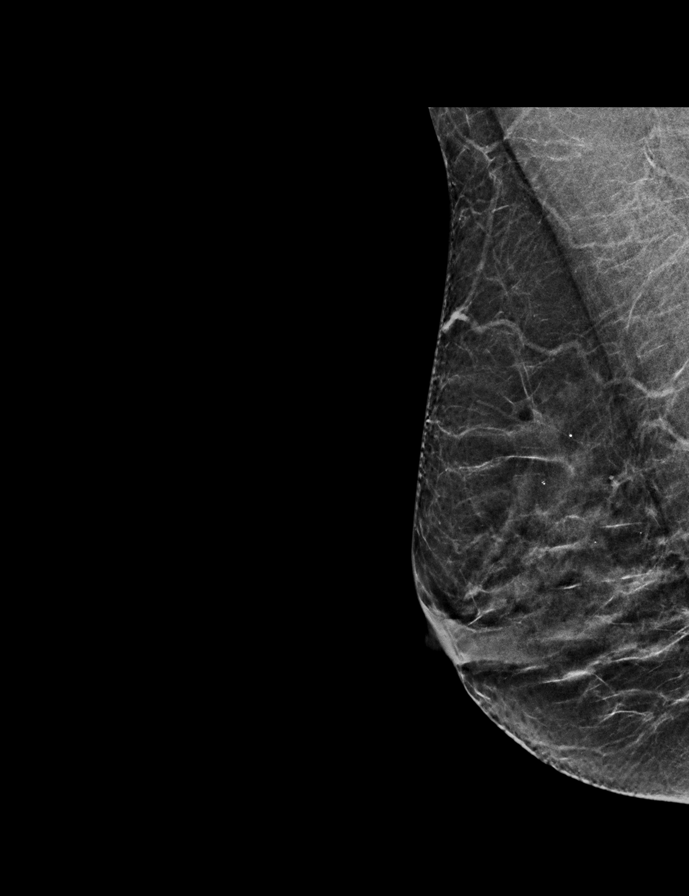

[R MLO]
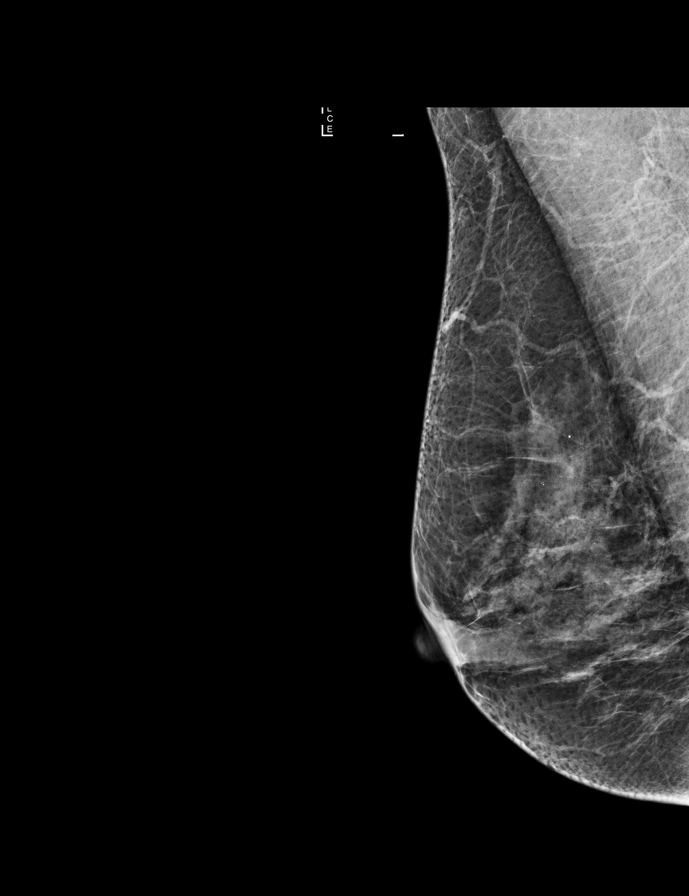

[L CC tomo · tomo slice 25/48.0]
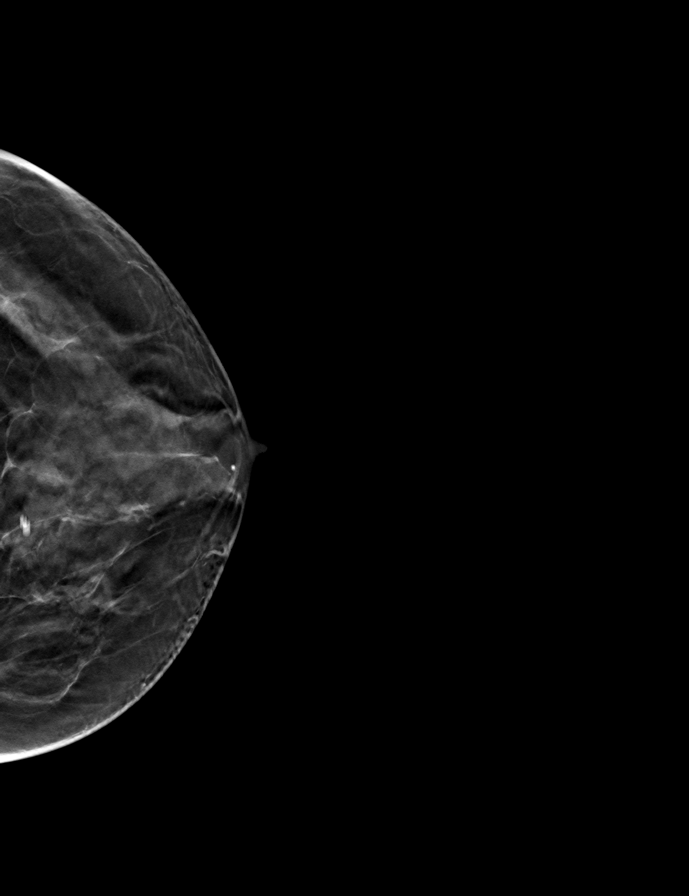

[9 of 28 positions shown; findings below may reference images not displayed]

ACR Breast Density Category c: The breast tissue is heterogeneously
dense, which may obscure small masses.
FINDINGS: In the left breast, a possible mass warrants further evaluation.
This possible mass is seen within the slightly inner left breast, at
posterior depth, best seen on the CC view, tomosynthesis CC slice
29, possible correlate within the slightly upper left breast on the
MLO projection, tomosynthesis MLO slice 29.

In the right breast, no findings suspicious for malignancy.

Images were processed with CAD.
IMPRESSION: Further evaluation is suggested for possible mass in the left
breast.

RECOMMENDATION:
Diagnostic mammogram and possibly ultrasound of the left breast.
(Code:MJ-[id number])

The patient will be contacted regarding the findings, and additional
imaging will be scheduled.

BI-RADS CATEGORY  0: Incomplete. Need additional imaging evaluation
and/or prior mammograms for comparison.

## 2017-08-28 ENCOUNTER — Telehealth: Payer: Self-pay

## 2017-08-28 MED ORDER — BUPROPION HCL ER (SR) 150 MG PO TB12: ORAL_TABLET | ORAL | 1 refills | Status: DC

## 2017-08-28 NOTE — Addendum Note (Signed)
Addended by: Karle Barr on: 08/28/2017 05:33 PM   Modules accepted: Orders

## 2017-08-28 NOTE — Telephone Encounter (Signed)
May have this seen 1 refill needs follow-up office visit

## 2017-08-28 NOTE — Telephone Encounter (Signed)
[facility] is requesting Bupropion Sr 150 mg One BID.Last seen 07/2016 for fatigue over a yr ago. Please advise.

## 2017-08-28 NOTE — Addendum Note (Signed)
Addended by: Karle Barr on: 08/28/2017 05:38 PM   Modules accepted: Orders

## 2017-08-29 ENCOUNTER — Other Ambulatory Visit: Payer: Self-pay | Admitting: Family Medicine

## 2017-08-29 ENCOUNTER — Other Ambulatory Visit: Payer: Self-pay | Admitting: *Deleted

## 2017-08-29 MED ORDER — BUPROPION HCL ER (SR) 150 MG PO TB12: ORAL_TABLET | ORAL | 1 refills | Status: DC

## 2017-08-29 NOTE — Telephone Encounter (Signed)
It appears that this was just ordered yesterday or earlier today with a refill and a request for office visit thank you

## 2017-09-19 ENCOUNTER — Encounter: Payer: Self-pay | Admitting: Family Medicine

## 2017-09-19 ENCOUNTER — Encounter (INDEPENDENT_AMBULATORY_CARE_PROVIDER_SITE_OTHER): Payer: Self-pay

## 2017-09-19 ENCOUNTER — Ambulatory Visit (INDEPENDENT_AMBULATORY_CARE_PROVIDER_SITE_OTHER): Payer: Medicare Other | Admitting: Family Medicine

## 2017-09-19 VITALS — BP 118/72 | Wt 137.0 lb

## 2017-09-19 DIAGNOSIS — R238 Other skin changes: Secondary | ICD-10-CM

## 2017-09-19 DIAGNOSIS — T148XXA Other injury of unspecified body region, initial encounter: Secondary | ICD-10-CM

## 2017-09-19 MED ORDER — VALACYCLOVIR HCL 1 G PO TABS
1000.0000 mg | ORAL_TABLET | Freq: Three times a day (TID) | ORAL | 0 refills | Status: DC
Start: 2017-09-19 — End: 2018-12-31

## 2017-09-19 NOTE — Progress Notes (Signed)
   Subjective:    Patient ID: Tiffany Perry, female    DOB: 08-04-1950, 67 y.o.   MRN: [id number]  HPI Patient arrives with c/o rash on back. Patient has a history of shingles. Patient relates a burning tingling sensation in her lower spine on the left side she states is been coming and going for several days it reminds her of what she felt when she had shingles she denies high fever chills sweats denies fatigue tiredness muscle aches or joint pain PMH benign  Review of Systems Negative for headache fever chills sweats cough vomiting diarrhea nausea abdominal pain    Objective:   Physical Exam Lungs clear respiratory rate normal heart is regular no murmurs extremities no edema muscle tone normal lower back no shingles is noted.  There is some bruising on her back she is not aware of how she got this she does not have any bruising anywhere else       Assessment & Plan:  Hematomas-the should gradually get better if she starts having more these she needs to let us know we will need to run some blood work  Burning in the skin- I do not recommend any type of lab work x-rays it is unlikely this is shingles but a prescription for Valtrex plus also education given what to watch for if patient does develop shingles she needs to start Valtrex  Patient will need to do welcome to Medicare visit coming up this spring

## 2017-09-20 DIAGNOSIS — H52223 Regular astigmatism, bilateral: Secondary | ICD-10-CM | POA: Diagnosis not present

## 2017-09-20 DIAGNOSIS — H04123 Dry eye syndrome of bilateral lacrimal glands: Secondary | ICD-10-CM | POA: Diagnosis not present

## 2017-09-20 DIAGNOSIS — H5203 Hypermetropia, bilateral: Secondary | ICD-10-CM | POA: Diagnosis not present

## 2017-09-20 DIAGNOSIS — H25813 Combined forms of age-related cataract, bilateral: Secondary | ICD-10-CM | POA: Diagnosis not present

## 2017-10-04 ENCOUNTER — Encounter (INDEPENDENT_AMBULATORY_CARE_PROVIDER_SITE_OTHER): Payer: Self-pay

## 2017-10-15 ENCOUNTER — Other Ambulatory Visit: Payer: Self-pay | Admitting: Family Medicine

## 2017-10-15 DIAGNOSIS — Z1231 Encounter for screening mammogram for malignant neoplasm of breast: Secondary | ICD-10-CM

## 2017-10-18 DIAGNOSIS — Z01419 Encounter for gynecological examination (general) (routine) without abnormal findings: Secondary | ICD-10-CM | POA: Diagnosis not present

## 2017-10-18 DIAGNOSIS — Z6824 Body mass index (BMI) 24.0-24.9, adult: Secondary | ICD-10-CM | POA: Diagnosis not present

## 2017-10-29 ENCOUNTER — Other Ambulatory Visit: Payer: Self-pay | Admitting: Family Medicine

## 2017-11-05 ENCOUNTER — Ambulatory Visit
Admission: RE | Admit: 2017-11-05 | Discharge: 2017-11-05 | Disposition: A | Discharge status: Home / Self Care | Payer: Medicare Other | Source: Ambulatory Visit | Attending: Family Medicine | Admitting: Family Medicine

## 2017-11-05 DIAGNOSIS — Z1231 Encounter for screening mammogram for malignant neoplasm of breast: Secondary | ICD-10-CM | POA: Diagnosis not present

## 2017-11-28 ENCOUNTER — Other Ambulatory Visit: Payer: Self-pay | Admitting: Family Medicine

## 2017-11-28 NOTE — Telephone Encounter (Signed)
This +3 refills needs follow-up office visit

## 2018-03-26 ENCOUNTER — Other Ambulatory Visit: Payer: Self-pay | Admitting: Family Medicine

## 2018-04-16 ENCOUNTER — Ambulatory Visit (INDEPENDENT_AMBULATORY_CARE_PROVIDER_SITE_OTHER): Payer: Medicare Other | Admitting: Family Medicine

## 2018-04-16 ENCOUNTER — Encounter: Payer: Self-pay | Admitting: Family Medicine

## 2018-04-16 VITALS — Temp 98.5°F | Ht 63.0 in | Wt 130.0 lb

## 2018-04-16 DIAGNOSIS — L2389 Allergic contact dermatitis due to other agents: Secondary | ICD-10-CM

## 2018-04-16 DIAGNOSIS — M1711 Unilateral primary osteoarthritis, right knee: Secondary | ICD-10-CM

## 2018-04-16 MED ORDER — METHYLPREDNISOLONE ACETATE 40 MG/ML IJ SUSP
40.0000 mg | Freq: Once | INTRAMUSCULAR | Status: AC
  Administered 2018-04-16: 40 mg via INTRAMUSCULAR

## 2018-04-16 MED ORDER — PREDNISONE 20 MG PO TABS: ORAL_TABLET | ORAL | 0 refills | Status: DC

## 2018-04-16 NOTE — Progress Notes (Signed)
   Subjective:    Patient ID: Tiffany Perry, female    DOB: 09-03-1950, 67 y.o.   MRN: [id number]  HPI Patient is here today with complaints of poison ivy on arm,legs and face with some facial swelling. She did some yard work on Sunday. She has been using calimine lotion. Patient with rash itching in the face in addition to this having trouble with arm areas on the arms and legs that are present going on  Patient takes her depression medicine on a regular basis moods are doing well denies being depressed would like to continue the medicine and is helping her  Patient with osteoarthritis of both knees worse on the right than the left would like to see orthopedic specialist  Review of Systems  Constitutional: Negative for activity change, fatigue and fever.  HENT: Negative for congestion and rhinorrhea.   Respiratory: Negative for cough, chest tightness and shortness of breath.   Cardiovascular: Negative for chest pain and leg swelling.  Gastrointestinal: Negative for abdominal pain and nausea.  Skin: Positive for rash. Negative for color change.  Neurological: Negative for dizziness and headaches.  Psychiatric/Behavioral: Negative for agitation and behavioral problems.       Objective:   Physical Exam  Constitutional: She appears well-nourished. No distress.  HENT:  Head: Normocephalic and atraumatic.  Eyes: Right eye exhibits no discharge. Left eye exhibits no discharge.  Neck: No tracheal deviation present.  Cardiovascular: Normal rate, regular rhythm and normal heart sounds.  No murmur heard. Pulmonary/Chest: Effort normal and breath sounds normal. No respiratory distress.  Musculoskeletal: She exhibits no edema.  Lymphadenopathy:    She has no cervical adenopathy.  Neurological: She is alert. Coordination normal.  Skin: Skin is warm and dry.  Psychiatric: She has a normal mood and affect. Her behavior is normal.  Vitals reviewed.  Osteoarthritis noted in both  knees worse on the right Severe contact dermatitis noted on the face on the arms extensive  15 minutes was spent with patient today discussing healthcare issues which they came.  More than 50% of this visit-total duration of visit-was spent in counseling and coordination of care.  Please see diagnosis regarding the focus of this coordination and care     Assessment & Plan:  Osteoarthritis right knee referral to Dr. Ricki Rodriguez.  Follow-up as needed  Depression monitoring normal.  Continue medication  Annual wellness overview recommended  Contact dermatitis-prednisone taper Depo-Medrol shot follow-up if problems

## 2018-04-19 ENCOUNTER — Encounter: Payer: Self-pay | Admitting: Family Medicine

## 2018-05-02 ENCOUNTER — Encounter: Payer: Self-pay | Admitting: Family Medicine

## 2018-06-05 DIAGNOSIS — M1711 Unilateral primary osteoarthritis, right knee: Secondary | ICD-10-CM | POA: Diagnosis not present

## 2018-06-05 DIAGNOSIS — M170 Bilateral primary osteoarthritis of knee: Secondary | ICD-10-CM | POA: Diagnosis not present

## 2018-07-18 DIAGNOSIS — M1711 Unilateral primary osteoarthritis, right knee: Secondary | ICD-10-CM | POA: Diagnosis not present

## 2018-07-26 DIAGNOSIS — M1711 Unilateral primary osteoarthritis, right knee: Secondary | ICD-10-CM | POA: Diagnosis not present

## 2018-08-01 ENCOUNTER — Other Ambulatory Visit: Payer: Self-pay | Admitting: Family Medicine

## 2018-08-01 DIAGNOSIS — M1711 Unilateral primary osteoarthritis, right knee: Secondary | ICD-10-CM | POA: Diagnosis not present

## 2018-08-01 NOTE — Telephone Encounter (Signed)
This and 2 rf

## 2018-08-14 ENCOUNTER — Ambulatory Visit (INDEPENDENT_AMBULATORY_CARE_PROVIDER_SITE_OTHER): Payer: Medicare Other | Admitting: Family Medicine

## 2018-08-14 ENCOUNTER — Encounter: Payer: Self-pay | Admitting: Family Medicine

## 2018-08-14 VITALS — BP 128/80 | Temp 98.0°F | Ht 63.0 in | Wt 127.6 lb

## 2018-08-14 DIAGNOSIS — J019 Acute sinusitis, unspecified: Secondary | ICD-10-CM | POA: Diagnosis not present

## 2018-08-14 MED ORDER — CEFPROZIL 500 MG PO TABS: 500.0000 mg | ORAL_TABLET | Freq: Two times a day (BID) | ORAL | 0 refills | Status: DC

## 2018-08-14 NOTE — Progress Notes (Signed)
   Subjective:    Patient ID: Tiffany Perry, female    DOB: 07/03/1951, 68 y.o.   MRN: [id number]  Cough  This is a new problem. The current episode started 1 to 4 weeks ago. Associated symptoms include ear pain, headaches, nasal congestion, rhinorrhea, a sore throat and wheezing. Pertinent negatives include no chest pain, fever or shortness of breath. Treatments tried: z pack. otc decongestant.   This patient unfortunately had respiratory illness that hit her before Christmas she was in [place].  Family friend out there prescribed her a Z-Pak she states she improved some but now she has persistent head congestion drainage coughing chest congestion she also states she felt a little bit short of breath but denies high fever chills sweats but states her energy level is poor and she does cough up some phlegm.   Review of Systems  Constitutional: Negative for activity change and fever.  HENT: Positive for congestion, ear pain, rhinorrhea and sore throat.   Eyes: Negative for discharge.  Respiratory: Positive for cough and wheezing. Negative for shortness of breath.   Cardiovascular: Negative for chest pain.  Neurological: Positive for headaches.       Objective:   Physical Exam Vitals signs and nursing note reviewed.  Constitutional:      Appearance: She is well-developed.  HENT:     Head: Normocephalic.     Nose: Nose normal.     Mouth/Throat:     Pharynx: No oropharyngeal exudate.  Neck:     Musculoskeletal: Neck supple.  Cardiovascular:     Rate and Rhythm: Normal rate.     Heart sounds: Normal heart sounds. No murmur.  Pulmonary:     Effort: Pulmonary effort is normal.     Breath sounds: Normal breath sounds. No wheezing.  Lymphadenopathy:     Cervical: No cervical adenopathy.  Skin:    General: Skin is warm and dry.   O2 saturation is 96% respiratory rate is normal I do not hear any crackles or pneumonia in addition to this patient's overall condition  seems to be stable with no sign of respiratory distress or air hunger        Assessment & Plan:  Recent viral illness with secondary rhinosinusitis and bronchitis probably due to the virus that would cause some prolonged coughing for a couple weeks  Secondary infection of the sinuses reasonable to use a different round of antibiotics this was prescribed she was encouraged to follow-up if progressive troubles warning signs were discussed  She is going to [place] tomorrow for a several day trip she should be able to do this okay but I told her to pace herself and not have excessive activity

## 2018-09-05 ENCOUNTER — Other Ambulatory Visit: Payer: Self-pay | Admitting: Family Medicine

## 2018-09-12 DIAGNOSIS — M25561 Pain in right knee: Secondary | ICD-10-CM | POA: Diagnosis not present

## 2018-10-02 DIAGNOSIS — H52223 Regular astigmatism, bilateral: Secondary | ICD-10-CM | POA: Diagnosis not present

## 2018-10-02 DIAGNOSIS — H524 Presbyopia: Secondary | ICD-10-CM | POA: Diagnosis not present

## 2018-10-02 DIAGNOSIS — H25813 Combined forms of age-related cataract, bilateral: Secondary | ICD-10-CM | POA: Diagnosis not present

## 2018-10-02 DIAGNOSIS — H5203 Hypermetropia, bilateral: Secondary | ICD-10-CM | POA: Diagnosis not present

## 2018-10-03 ENCOUNTER — Other Ambulatory Visit: Payer: Self-pay | Admitting: Family Medicine

## 2018-10-03 DIAGNOSIS — Z1231 Encounter for screening mammogram for malignant neoplasm of breast: Secondary | ICD-10-CM

## 2018-10-05 DIAGNOSIS — Z23 Encounter for immunization: Secondary | ICD-10-CM | POA: Diagnosis not present

## 2018-11-07 ENCOUNTER — Ambulatory Visit: Payer: Medicare Other

## 2018-12-13 ENCOUNTER — Ambulatory Visit
Admission: RE | Admit: 2018-12-13 | Discharge: 2018-12-13 | Disposition: A | Discharge status: Home / Self Care | Payer: Medicare Other | Source: Ambulatory Visit | Attending: Family Medicine | Admitting: Family Medicine

## 2018-12-13 ENCOUNTER — Other Ambulatory Visit: Payer: Self-pay

## 2018-12-13 DIAGNOSIS — Z1231 Encounter for screening mammogram for malignant neoplasm of breast: Secondary | ICD-10-CM

## 2018-12-19 ENCOUNTER — Ambulatory Visit: Payer: Medicare Other

## 2018-12-31 ENCOUNTER — Other Ambulatory Visit: Payer: Self-pay

## 2018-12-31 ENCOUNTER — Ambulatory Visit (INDEPENDENT_AMBULATORY_CARE_PROVIDER_SITE_OTHER): Payer: Medicare Other | Admitting: Family Medicine

## 2018-12-31 DIAGNOSIS — M1710 Unilateral primary osteoarthritis, unspecified knee: Secondary | ICD-10-CM | POA: Diagnosis not present

## 2018-12-31 MED ORDER — ETODOLAC 400 MG PO TABS: 400.0000 mg | ORAL_TABLET | Freq: Two times a day (BID) | ORAL | 1 refills | Status: DC

## 2018-12-31 NOTE — Progress Notes (Signed)
   Subjective:    Patient ID: Tiffany Perry, female    DOB: [birth year], 68 y.o.   MRN: [id number]  Knee Pain   Incident onset: march 17th but worse on may 22nd. There was no injury mechanism. Pain location: right knee. She has tried ice (aleve one bid) for the symptoms.  Significant knee pain discomfort hurts with certain movements and hurts with walking.  And injections back and landed December and early January not able to get more injections until July does have osteoarthritis in her knees unable to do the swimming that she was doing with the [place] [facility] in [place] last November and had gel injections but not able to do anymore til July.   Virtual Visit via Video Note  I connected with Kirstin Kugler Vajda on 12/31/18 at  3:00 PM EDT by a video enabled telemedicine application and verified that I am speaking with the correct person using two identifiers.  Location: Patient: home  Provider: office   I discussed the limitations of evaluation and management by telemedicine and the availability of in person appointments. The patient expressed understanding and agreed to proceed.  History of Present Illness:    Observations/Objective:   Assessment and Plan:   Follow Up Instructions:    I discussed the assessment and treatment plan with the patient. The patient was provided an opportunity to ask questions and all were answered. The patient agreed with the plan and demonstrated an understanding of the instructions.   The patient was advised to call back or seek an in-person evaluation if the symptoms worsen or if the condition fails to improve as anticipated.  I provided 15 minutes of non-face-to-face time during this encounter.      Review of Systems  Constitutional: Negative for activity change and appetite change.  HENT: Negative for congestion and rhinorrhea.   Respiratory: Negative for cough and shortness of breath.   Cardiovascular: Negative for chest  pain and leg swelling.  Gastrointestinal: Negative for abdominal pain, nausea and vomiting.  Musculoskeletal: Positive for arthralgias, gait problem and joint swelling. Negative for back pain.  Skin: Negative for color change.  Neurological: Negative for dizziness and weakness.  Psychiatric/Behavioral: Negative for agitation and confusion.       Objective:   Physical Exam  Patient had virtual visit Appears to be in no distress Atraumatic Neuro able to relate and oriented No apparent resp distress Color normal       Assessment & Plan:  Osteoarthritis of the knee Etodolac 400 mg 1 twice daily over the course the next couple weeks See orthopedics Cool compresses Send [place] update in a few weeks time

## 2019-01-10 DIAGNOSIS — M1711 Unilateral primary osteoarthritis, right knee: Secondary | ICD-10-CM | POA: Diagnosis not present

## 2019-01-11 ENCOUNTER — Encounter: Payer: Self-pay | Admitting: Family Medicine

## 2019-01-14 DIAGNOSIS — Z01419 Encounter for gynecological examination (general) (routine) without abnormal findings: Secondary | ICD-10-CM | POA: Diagnosis not present

## 2019-01-14 DIAGNOSIS — Z6822 Body mass index (BMI) 22.0-22.9, adult: Secondary | ICD-10-CM | POA: Diagnosis not present

## 2019-01-17 DIAGNOSIS — M1711 Unilateral primary osteoarthritis, right knee: Secondary | ICD-10-CM | POA: Diagnosis not present

## 2019-01-21 ENCOUNTER — Encounter: Payer: Self-pay | Admitting: Family Medicine

## 2019-01-21 NOTE — Telephone Encounter (Signed)
Please go ahead with setting up bone density per patient request

## 2019-01-23 ENCOUNTER — Encounter: Payer: Self-pay | Admitting: Family Medicine

## 2019-01-23 ENCOUNTER — Other Ambulatory Visit: Payer: Self-pay | Admitting: Family Medicine

## 2019-01-23 DIAGNOSIS — M8580 Other specified disorders of bone density and structure, unspecified site: Secondary | ICD-10-CM

## 2019-01-28 ENCOUNTER — Other Ambulatory Visit: Payer: Self-pay | Admitting: Family Medicine

## 2019-01-28 NOTE — Telephone Encounter (Signed)
This +2 refills 

## 2019-02-02 ENCOUNTER — Telehealth: Payer: Self-pay | Admitting: Family Medicine

## 2019-02-02 NOTE — Telephone Encounter (Signed)
Patient has knee operation coming up in August.  Her orthopedic is requesting medical clearance. This requires an office visit. Preferably in office Can be done virtually patient refuses to come to the office This can be done this week.  Upon completion of this we can send medical clearance to her surgeon

## 2019-02-03 ENCOUNTER — Encounter: Payer: Self-pay | Admitting: Family Medicine

## 2019-02-03 NOTE — Telephone Encounter (Signed)
Please call and schedule appt. thanks

## 2019-02-14 ENCOUNTER — Other Ambulatory Visit: Payer: Self-pay

## 2019-02-14 ENCOUNTER — Ambulatory Visit
Admission: RE | Admit: 2019-02-14 | Discharge: 2019-02-14 | Disposition: A | Discharge status: Home / Self Care | Payer: Medicare Other | Source: Ambulatory Visit | Attending: Family Medicine | Admitting: Family Medicine

## 2019-02-14 DIAGNOSIS — M8589 Other specified disorders of bone density and structure, multiple sites: Secondary | ICD-10-CM | POA: Diagnosis not present

## 2019-02-14 DIAGNOSIS — Z780 Asymptomatic menopausal state: Secondary | ICD-10-CM | POA: Diagnosis not present

## 2019-02-14 DIAGNOSIS — M8580 Other specified disorders of bone density and structure, unspecified site: Secondary | ICD-10-CM

## 2019-02-14 DIAGNOSIS — M25561 Pain in right knee: Secondary | ICD-10-CM | POA: Diagnosis not present

## 2019-02-14 DIAGNOSIS — M1711 Unilateral primary osteoarthritis, right knee: Secondary | ICD-10-CM | POA: Diagnosis not present

## 2019-02-16 ENCOUNTER — Telehealth: Payer: Self-pay | Admitting: Family Medicine

## 2019-02-16 ENCOUNTER — Encounter: Payer: Self-pay | Admitting: Family Medicine

## 2019-02-16 NOTE — Telephone Encounter (Signed)
I received a preoperative evaluation request from [facility] Patient having total knee replacement coming up Please communicate with patient that [facility] has requested [place] to do a preoperative consultation This could be done virtually if the patient desires to do it that way I recommend that this be completed within the next 7 to 10 days

## 2019-02-17 NOTE — Telephone Encounter (Signed)
Pt contacted and verbalized understanding. Pt transferred up front to schedule appt.

## 2019-02-17 NOTE — H&P (Signed)
TOTAL KNEE ADMISSION H&P  Patient is being admitted for right total knee arthroplasty.  Subjective:  Chief Complaint:right knee pain.  HPI: Tiffany Perry, 68 y.o. female, has a history of pain and functional disability in the right knee due to arthritis and has failed non-surgical conservative treatments for greater than 12 weeks to includeNSAID's and/or analgesics, corticosteriod injections, viscosupplementation injections and activity modification.  Onset of symptoms was gradual, starting >10 years ago with rapidlly worsening course since that time. The patient noted no past surgery on the right knee(s).  Patient currently rates pain in the right knee(s) at 9 out of 10 with activity. Patient has worsening of pain with activity and weight bearing, pain that interferes with activities of daily living and joint swelling.  Patient has evidence of bone-on-bone arthritis in the medial compartment with slight varus deformity by imaging studies. There is no active infection.  Patient Active Problem List   Diagnosis Date Noted  . Osteopenia 08/10/2016  . Chronic depression 11/11/2014  . JOINT PAIN, HAND 12/30/2008  . TRIGGER FINGER 12/30/2008   Past Medical History:  Diagnosis Date  . Depression   . GAD (generalized anxiety disorder)   . Osteoarthritis   . Osteoporosis     Past Surgical History:  Procedure Laterality Date  . COLONOSCOPY  11/16/2011   Procedure: COLONOSCOPY;  Surgeon: Rogene Houston, MD;  Location: [facility];  Service: Endoscopy;  Laterality: N/A;  730  . COLONOSCOPY N/A 01/13/2015   Procedure: COLONOSCOPY;  Surgeon: Rogene Houston, MD;  Location: [facility];  Service: Endoscopy;  Laterality: N/A;  830  . DILATION AND CURETTAGE OF UTERUS    . FOOT SURGERY      No current facility-administered medications for this encounter.    Current Outpatient Medications  Medication Sig Dispense Refill Last Dose  . ALPRAZolam (XANAX) 0.5 MG tablet TAKE 1 TABLET BY  MOUTH TWICE DAILY AS NEEDED FOR ANXIETY OR SLEEP. 30 tablet 2 Taking  . buPROPion (WELLBUTRIN SR) 150 MG 12 hr tablet TAKE (1) TABLET TWICE DAILY. 60 tablet 5 Taking  . Calcium-Magnesium-Vitamin D (CALCIUM 500 PO) Take 1 tablet by mouth daily. Reported on 12/06/2015   Taking  . etodolac (LODINE) 400 MG tablet TAKE (1) TABLET BY MOUTH TWICE DAILY. 30 tablet 2   . naproxen sodium (ANAPROX) 220 MG tablet Take 220 mg by mouth 2 (two) times daily with a meal.   Taking  . vitamin B-12 (CYANOCOBALAMIN) 100 MCG tablet Take 100 mcg by mouth daily.   Taking   Allergies  Allergen Reactions  . Codeine Nausea And Vomiting    Social History   Tobacco Use  . Smoking status: Never Smoker  . Smokeless tobacco: Never Used  Substance Use Topics  . Alcohol use: No    Family History  Problem Relation Age of Onset  . Colon cancer Cousin 35  . Hypertension Father   . Heart attack Father   . Breast cancer Neg Hx      Review of Systems  Constitutional: Negative for chills and fever.  Respiratory: Negative for cough and shortness of breath.   Cardiovascular: Negative for chest pain and palpitations.  Gastrointestinal: Negative for nausea and vomiting.  Musculoskeletal: Positive for joint pain.  Neurological: Negative for tingling and sensory change.    Objective:  Physical Exam Patient is a 68 year old female.  Well nourished and well developed. General: Alert and oriented x3, cooperative and pleasant, no acute distress. Head: normocephalic, atraumatic, neck supple. Eyes:  EOMI. Respiratory: breath sounds clear in all fields, no wheezing, rales, or rhonchi. Cardiovascular: Regular rate and rhythm, no murmurs, gallops or rubs. Abdomen: non-tender to palpation and soft, normoactive bowel sounds. Musculoskeletal: Right Knee Exam: No effusion present. No swelling present. Slight varus deformity. The range of motion is: 5 to 130 degrees. Some crepitus on range of motion of the knee. Medial joint  line tenderness is present. Lateral joint line tenderness is present. The knee is stable.   Calves soft and nontender. Motor function intact in LE. Strength 5/5 LE bilaterally. Neuro: Distal pulses 2+. Sensation to light touch intact in LE.  Vital signs in last 24 hours: BP: 129/74 mmHg Pulse: 64 bpm  Labs:  Estimated body mass index is 22.6 kg/m as calculated from the following:   Height as of 08/14/18: 5\' 3"  (1.6 m).   Weight as of 08/14/18: 57.9 kg.   Imaging Review Plain radiographs demonstrate severe degenerative joint disease of the right knee(s). The overall alignment ismild varus. The bone quality appears to be adequate for age and reported activity level.  Assessment/Plan:  End stage arthritis, right knee   The patient history, physical examination, clinical judgment of the provider and imaging studies are consistent with end stage degenerative joint disease of the right knee(s) and total knee arthroplasty is deemed medically necessary. The treatment options including medical management, injection therapy arthroscopy and arthroplasty were discussed at length. The risks and benefits of total knee arthroplasty were presented and reviewed. The risks due to aseptic loosening, infection, stiffness, patella tracking problems, thromboembolic complications and other imponderables were discussed. The patient acknowledged the explanation, agreed to proceed with the plan and consent was signed. Patient is being admitted for inpatient treatment for surgery, pain control, PT, OT, prophylactic antibiotics, VTE prophylaxis, progressive ambulation and ADL's and discharge planning. The patient is planning to be discharged home.\  Patient's anticipated LOS is less than 2 midnights, meeting these requirements: - Younger than 84 - Lives within 1 hour of care - Has a competent adult at home to recover with post-op recover - NO history of  - Chronic pain requiring opiods  - Diabetes  - Coronary  Artery Disease  - Heart failure  - Heart attack  - Stroke  - DVT/VTE  - Cardiac arrhythmia  - Respiratory Failure/COPD  - Renal failure  - Anemia  - Advanced Liver disease  Therapy Plans: outpatient [facility] at [facility] on 8[facility]13 Disposition: Home with husband Planned DVT Prophylaxis: aspirin 325mg  BID DME needed: none PCP: Dr. Sallee Lange  TXA: IV Allergies: codeine - vomiting  Anesthesia Concerns: none BMI: 22.5  Other: none  - Patient was instructed on what medications to stop prior to surgery. - Follow-up visit in 2 weeks with Dr. Wynelle Link - Begin physical therapy following surgery - Pre-operative lab work as pre-surgical testing - Prescriptions will be provided in hospital at time of discharge  Griffith Citron, PA-C Orthopedic Surgery [facility] [place]

## 2019-02-20 ENCOUNTER — Other Ambulatory Visit: Payer: Self-pay

## 2019-02-21 ENCOUNTER — Ambulatory Visit (INDEPENDENT_AMBULATORY_CARE_PROVIDER_SITE_OTHER): Payer: Medicare Other | Admitting: Family Medicine

## 2019-02-21 DIAGNOSIS — M1710 Unilateral primary osteoarthritis, unspecified knee: Secondary | ICD-10-CM | POA: Diagnosis not present

## 2019-02-21 NOTE — Progress Notes (Signed)
   Subjective:    Patient ID: Tiffany Perry, female    DOB: 03/15/1951, 68 y.o.   MRN: [id number]  HPI Video visit Presurgical clearance Patient states her overall health is doing well.  She denies any type of chest tightness pressure pains or shortness of breath.  She is able to swim on a regular basis at the [facility] do the labs without having any difficulty.  She does have significant knee pain and discomfort which limits the ability to walk.  She is on anti-inflammatories for that.  She relates that she is looking forward to getting her knee taken care of. Patient calls to discuss presurgical clearance for total right knee replacement on 03/10/2019  Virtual Visit via Video Note  I connected with Tiffany Perry on 02/21/19 at  2:00 PM EDT by a video enabled telemedicine application and verified that I am speaking with the correct person using two identifiers.  Location: Patient: home Provider: office   I discussed the limitations of evaluation and management by telemedicine and the availability of in person appointments. The patient expressed understanding and agreed to proceed.  History of Present Illness:    Observations/Objective:   Assessment and Plan:   Follow Up Instructions:    I discussed the assessment and treatment plan with the patient. The patient was provided an opportunity to ask questions and all were answered. The patient agreed with the plan and demonstrated an understanding of the instructions.   The patient was advised to call back or seek an in-person evaluation if the symptoms worsen or if the condition fails to improve as anticipated.  I provided 15 minutes of non-face-to-face time during this encounter.    Review of Systems  Constitutional: Negative for activity change, appetite change and fatigue.  HENT: Negative for congestion and rhinorrhea.   Respiratory: Negative for cough and shortness of breath.   Cardiovascular: Negative for  chest pain and leg swelling.  Gastrointestinal: Negative for abdominal pain and diarrhea.  Endocrine: Negative for polydipsia and polyphagia.  Skin: Negative for color change.  Neurological: Negative for dizziness and weakness.  Psychiatric/Behavioral: Negative for behavioral problems and confusion.       Objective:   Physical Exam  Patient had virtual visit Appears to be in no distress Atraumatic Neuro able to relate and oriented No apparent resp distress Color normal       Assessment & Plan:  Severe osteoarthritis Knee replacement surgery coming up She is medically approved for surgery.  I do not feel she has any significant underlying cardiac issues.  The fact that she can do swimming without difficulty indicates that she should be able to do well with the surgery.  The patient is aware that there is no such thing as a risk-free surgery.  She understands there is still a possibility she could have a cardiac event with the surgery but it is low risk and it is not recommended for cardiology referral for this  I would recommend staying away from anti-inflammatories for 7 days prior to surgery plus also recommend early mobilization after surgery and following all directions from the orthopedic doctor to lessen the risk of DVTs  The patient in the past her lab work is look good there is absolutely no reason to put her through lab work right now we will let her do her presurgical lab work with her orthopedic group.  We will send a copy of this dictation to the orthopedist

## 2019-02-25 ENCOUNTER — Other Ambulatory Visit: Payer: Medicare Other

## 2019-02-25 DIAGNOSIS — M25561 Pain in right knee: Secondary | ICD-10-CM | POA: Insufficient documentation

## 2019-03-05 ENCOUNTER — Encounter (HOSPITAL_COMMUNITY): Payer: Self-pay

## 2019-03-05 ENCOUNTER — Other Ambulatory Visit: Payer: Self-pay

## 2019-03-05 ENCOUNTER — Encounter (HOSPITAL_COMMUNITY)
Admission: RE | Admit: 2019-03-05 | Discharge: 2019-03-05 | Disposition: A | Discharge status: Home / Self Care | Payer: Medicare Other | Source: Ambulatory Visit | Attending: Orthopedic Surgery | Admitting: Orthopedic Surgery

## 2019-03-05 ENCOUNTER — Other Ambulatory Visit: Payer: Self-pay | Admitting: Family Medicine

## 2019-03-05 DIAGNOSIS — Z20828 Contact with and (suspected) exposure to other viral communicable diseases: Secondary | ICD-10-CM | POA: Diagnosis not present

## 2019-03-05 DIAGNOSIS — M1711 Unilateral primary osteoarthritis, right knee: Secondary | ICD-10-CM | POA: Insufficient documentation

## 2019-03-05 DIAGNOSIS — Z01812 Encounter for preprocedural laboratory examination: Secondary | ICD-10-CM | POA: Insufficient documentation

## 2019-03-05 HISTORY — DX: Personal history of diseases of the blood and blood-forming organs and certain disorders involving the immune mechanism: Z86.2

## 2019-03-05 LAB — CBC
HCT: 44.7 % (ref 36.0–46.0)
Hemoglobin: 14.6 g/dL (ref 12.0–15.0)
MCH: 31.6 pg (ref 26.0–34.0)
MCHC: 32.7 g/dL (ref 30.0–36.0)
MCV: 96.8 fL (ref 80.0–100.0)
Platelets: 236 10*3/uL (ref 150–400)
RBC: 4.62 MIL/uL (ref 3.87–5.11)
RDW: 12.1 % (ref 11.5–15.5)
WBC: 6.5 10*3/uL (ref 4.0–10.5)
nRBC: 0 % (ref 0.0–0.2)

## 2019-03-05 LAB — COMPREHENSIVE METABOLIC PANEL
ALT: 19 U/L (ref 0–44)
AST: 19 U/L (ref 15–41)
Albumin: 3.9 g/dL (ref 3.5–5.0)
Alkaline Phosphatase: 80 U/L (ref 38–126)
Anion gap: 10 (ref 5–15)
BUN: 13 mg/dL (ref 8–23)
CO2: 25 mmol/L (ref 22–32)
Calcium: 9.4 mg/dL (ref 8.9–10.3)
Chloride: 107 mmol/L (ref 98–111)
Creatinine, Ser: 0.82 mg/dL (ref 0.44–1.00)
GFR calc Af Amer: >60 mL/min (ref >60)
GFR calc non Af Amer: >60 mL/min (ref >60)
Glucose, Bld: 89 mg/dL (ref 70–99)
Potassium: 3.9 mmol/L (ref 3.5–5.1)
Sodium: 142 mmol/L (ref 135–145)
Total Bilirubin: 0.6 mg/dL (ref 0.3–1.2)
Total Protein: 6.9 g/dL (ref 6.5–8.1)

## 2019-03-05 LAB — PROTIME-INR
INR: 0.9 (ref 0.8–1.2)
Prothrombin Time: 12.2 seconds (ref 11.4–15.2)

## 2019-03-05 LAB — APTT: aPTT: 32 seconds (ref 24–36)

## 2019-03-05 LAB — SURGICAL PCR SCREEN
MRSA, PCR: NEGATIVE
Staphylococcus aureus: NEGATIVE

## 2019-03-05 LAB — ABO/RH: ABO/RH(D): O POS

## 2019-03-05 NOTE — Patient Instructions (Addendum)
DUE TO COVID-19 ONLY ONE VISITOR IS ALLOWED TO VISIT DURING VISITOR HOURS ONLY!!   COVID SWAB TESTING MUST BE COMPLETED ON: Thursday, Aug. 6, 2020 at 3:05PM    [facility]  (Must self quarantine after testing. Follow instructions on handout.)             Your procedure is scheduled on: Monday, Mar 10, 2019   Report to [facility] Main  Entrance    Report to admitting at 6:55 AM   Call this number if you have problems the morning of surgery [phone]   Do not eat food or drink liquids :After Midnight.   Brush your teeth the morning of surgery.   Do NOT smoke after Midnight   Take these medicines the morning of surgery with A SIP OF WATER: Bupropion, Xanax if needed                               You may not have any metal on your body including hair pins, jewelry, and body piercings             Do not wear make-up, lotions, powders, perfumes/cologne, or deodorant             Do not wear nail polish.  Do not shave  48 hours prior to surgery.               Do not bring valuables to the hospital. [place].   Contacts, dentures or bridgework may not be worn into surgery.   Bring small overnight bag day of surgery.    Special Instructions: Bring a copy of your healthcare power of attorney and living will documents         the day of surgery if you haven't scanned them in before.              Please read over the following fact sheets you were given:  [facility] - Preparing for Surgery Before surgery, you can play an important role.  Because skin is not sterile, your skin needs to be as free of germs as possible.  You can reduce the number of germs on your skin by washing with CHG (chlorahexidine gluconate) soap before surgery.  CHG is an antiseptic cleaner which kills germs and bonds with the skin to continue killing germs even after washing. Please DO NOT use if you have an allergy to CHG or antibacterial soaps.   If your skin becomes reddened/irritated stop using the CHG and inform your nurse when you arrive at Short Stay. Do not shave (including legs and underarms) for at least 48 hours prior to the first CHG shower.  You may shave your face/neck.  Please follow these instructions carefully:  1.  Shower with CHG Soap the night before surgery and the  morning of surgery.  2.  If you choose to wash your hair, wash your hair first as usual with your normal  shampoo.  3.  After you shampoo, rinse your hair and body thoroughly to remove the shampoo.                             4.  Use CHG as you would any other liquid soap.  You can apply chg directly to  the skin and wash.  Gently with a scrungie or clean washcloth.  5.  Apply the CHG Soap to your body ONLY FROM THE NECK DOWN.   Do   not use on face/ open                           Wound or open sores. Avoid contact with eyes, ears mouth and   genitals (private parts).                       Wash face,  Genitals (private parts) with your normal soap.             6.  Wash thoroughly, paying special attention to the area where your    surgery  will be performed.  7.  Thoroughly rinse your body with warm water from the neck down.  8.  DO NOT shower/wash with your normal soap after using and rinsing off the CHG Soap.                9.  Pat yourself dry with a clean towel.            10.  Wear clean pajamas.            11.  Place clean sheets on your bed the night of your first shower and do not  sleep with pets. Day of Surgery : Do not apply any lotions/deodorants the morning of surgery.  Please wear clean clothes to the hospital/surgery center.  FAILURE TO FOLLOW THESE INSTRUCTIONS MAY RESULT IN THE CANCELLATION OF YOUR SURGERY  PATIENT SIGNATURE_________________________________  NURSE SIGNATURE__________________________________  ________________________________________________________________________  Tiffany Perry  An incentive spirometer is a  tool that can help keep your lungs clear and active. This tool measures how well you are filling your lungs with each breath. Taking long deep breaths may help reverse or decrease the chance of developing breathing (pulmonary) problems (especially infection) following:  A long period of time when you are unable to move or be active. BEFORE THE PROCEDURE   If the spirometer includes an indicator to show your best effort, your nurse or respiratory therapist will set it to a desired goal.  If possible, sit up straight or lean slightly forward. Try not to slouch.  Hold the incentive spirometer in an upright position. INSTRUCTIONS FOR USE  1. Sit on the edge of your bed if possible, or sit up as far as you can in bed or on a chair. 2. Hold the incentive spirometer in an upright position. 3. Breathe out normally. 4. Place the mouthpiece in your mouth and seal your lips tightly around it. 5. Breathe in slowly and as deeply as possible, raising the piston or the ball toward the top of the column. 6. Hold your breath for 3-5 seconds or for as long as possible. Allow the piston or ball to fall to the bottom of the column. 7. Remove the mouthpiece from your mouth and breathe out normally. 8. Rest for a few seconds and repeat Steps 1 through 7 at least 10 times every 1-2 hours when you are awake. Take your time and take a few normal breaths between deep breaths. 9. The spirometer may include an indicator to show your best effort. Use the indicator as a goal to work toward during each repetition. 10. After each set of 10 deep breaths, practice coughing to be sure your lungs are clear. If you  have an incision (the cut made at the time of surgery), support your incision when coughing by placing a pillow or rolled up towels firmly against it. Once you are able to get out of bed, walk around indoors and cough well. You may stop using the incentive spirometer when instructed by your caregiver.  RISKS AND  COMPLICATIONS  Take your time so you do not get dizzy or light-headed.  If you are in pain, you may need to take or ask for pain medication before doing incentive spirometry. It is harder to take a deep breath if you are having pain. AFTER USE  Rest and breathe slowly and easily.  It can be helpful to keep track of a log of your progress. Your caregiver can provide you with a simple table to help with this. If you are using the spirometer at home, follow these instructions: [place] IF:   You are having difficultly using the spirometer.  You have trouble using the spirometer as often as instructed.  Your pain medication is not giving enough relief while using the spirometer.  You develop fever of 100.5 F (38.1 C) or higher. SEEK IMMEDIATE MEDICAL CARE IF:   You cough up bloody sputum that had not been present before.  You develop fever of 102 F (38.9 C) or greater.  You develop worsening pain at or near the incision site. MAKE SURE YOU:   Understand these instructions.  Will watch your condition.  Will get help right away if you are not doing well or get worse. Document Released: 11/27/2006 Document Revised: 10/09/2011 Document Reviewed: 01/28/2007 Exit[employer] Patient Information 2014 [employer], [place].   ________________________________________________________________________  WHAT IS A BLOOD TRANSFUSION? Blood Transfusion Information  A transfusion is the replacement of blood or some of its parts. Blood is made up of multiple cells which provide different functions.  Red blood cells carry oxygen and are used for blood loss replacement.  White blood cells fight against infection.  Platelets control bleeding.  Plasma helps clot blood.  Other blood products are available for specialized needs, such as hemophilia or other clotting disorders. BEFORE THE TRANSFUSION  Who gives blood for transfusions?   Healthy volunteers who are fully evaluated to make sure  their blood is safe. This is blood bank blood. Transfusion therapy is the safest it has ever been in the practice of medicine. Before blood is taken from a donor, a complete history is taken to make sure that person has no history of diseases nor engages in risky social behavior (examples are intravenous drug use or sexual activity with multiple partners). The donor's travel history is screened to minimize risk of transmitting infections, such as malaria. The donated blood is tested for signs of infectious diseases, such as HIV and hepatitis. The blood is then tested to be sure it is compatible with you in order to minimize the chance of a transfusion reaction. If you or a relative donates blood, this is often done in anticipation of surgery and is not appropriate for emergency situations. It takes many days to process the donated blood. RISKS AND COMPLICATIONS Although transfusion therapy is very safe and saves many lives, the main dangers of transfusion include:   Getting an infectious disease.  Developing a transfusion reaction. This is an allergic reaction to something in the blood you were given. Every precaution is taken to prevent this. The decision to have a blood transfusion has been considered carefully by your caregiver before blood is given. Blood is  not given unless the benefits outweigh the risks. AFTER THE TRANSFUSION  Right after receiving a blood transfusion, you will usually feel much better and more energetic. This is especially true if your red blood cells have gotten low (anemic). The transfusion raises the level of the red blood cells which carry oxygen, and this usually causes an energy increase.  The nurse administering the transfusion will monitor you carefully for complications. HOME CARE INSTRUCTIONS  No special instructions are needed after a transfusion. You may find your energy is better. Speak with your caregiver about any limitations on activity for underlying diseases  you may have. SEEK MEDICAL CARE IF:   Your condition is not improving after your transfusion.  You develop redness or irritation at the intravenous (IV) site. SEEK IMMEDIATE MEDICAL CARE IF:  Any of the following symptoms occur over the next 12 hours:  Shaking chills.  You have a temperature by mouth above 102 F (38.9 C), not controlled by medicine.  Chest, back, or muscle pain.  People around you feel you are not acting correctly or are confused.  Shortness of breath or difficulty breathing.  Dizziness and fainting.  You get a rash or develop hives.  You have a decrease in urine output.  Your urine turns a dark color or changes to pink, red, or brown. Any of the following symptoms occur over the next 10 days:  You have a temperature by mouth above 102 F (38.9 C), not controlled by medicine.  Shortness of breath.  Weakness after normal activity.  The white part of the eye turns yellow (jaundice).  You have a decrease in the amount of urine or are urinating less often.  Your urine turns a dark color or changes to pink, red, or brown. Document Released: 07/14/2000 Document Revised: 10/09/2011 Document Reviewed: 03/02/2008 [facility] Patient Information 2014 [place], [place].  _______________________________________________________________________

## 2019-03-05 NOTE — Progress Notes (Signed)
SPOKE W/  Pat     SCREENING SYMPTOMS OF COVID 19:   COUGH--NO  RUNNY NOSE--- NO  SORE THROAT---NO  NASAL CONGESTION----NO  SNEEZING----NO  SHORTNESS OF BREATH---NO  DIFFICULTY BREATHING---NO  TEMP >100.0 -----NO  UNEXPLAINED BODY ACHES------NO  CHILLS -------- NO  HEADACHES ---------NONO  LOSS OF SMELL/ TASTE --------    HAVE YOU OR ANY FAMILY MEMBER TRAVELLED PAST 14 DAYS OUT OF THE   COUNTY---Lives in [place]----NO COUNTRY----NO  HAVE YOU OR ANY FAMILY MEMBER BEEN EXPOSED TO ANYONE WITH COVID 19? NO

## 2019-03-05 NOTE — Progress Notes (Signed)
Surgical clearance Dr. Wolfgang Phoenix 02/23/2019 in chart

## 2019-03-06 ENCOUNTER — Other Ambulatory Visit (HOSPITAL_COMMUNITY)
Admission: RE | Admit: 2019-03-06 | Discharge: 2019-03-06 | Disposition: A | Discharge status: Home / Self Care | Payer: Medicare Other | Source: Ambulatory Visit | Attending: Orthopedic Surgery | Admitting: Orthopedic Surgery

## 2019-03-06 DIAGNOSIS — M1711 Unilateral primary osteoarthritis, right knee: Secondary | ICD-10-CM | POA: Diagnosis not present

## 2019-03-06 DIAGNOSIS — Z01812 Encounter for preprocedural laboratory examination: Secondary | ICD-10-CM | POA: Diagnosis not present

## 2019-03-06 DIAGNOSIS — Z20828 Contact with and (suspected) exposure to other viral communicable diseases: Secondary | ICD-10-CM | POA: Diagnosis not present

## 2019-03-07 LAB — SARS CORONAVIRUS 2 (TAT 6-24 HRS): SARS Coronavirus 2: NEGATIVE

## 2019-03-09 MED ORDER — BUPIVACAINE LIPOSOME 1.3 % IJ SUSP
20.0000 mL | INTRAMUSCULAR | Status: DC
  Filled 2019-03-09: qty 20

## 2019-03-10 ENCOUNTER — Observation Stay (HOSPITAL_COMMUNITY)
Admission: RE | Admit: 2019-03-10 | Discharge: 2019-03-11 | Disposition: A | Discharge status: Home / Self Care | Payer: Medicare Other | Attending: Orthopedic Surgery | Admitting: Orthopedic Surgery

## 2019-03-10 ENCOUNTER — Encounter (HOSPITAL_COMMUNITY): Payer: Self-pay | Admitting: Emergency Medicine

## 2019-03-10 ENCOUNTER — Encounter (HOSPITAL_COMMUNITY)
Admission: RE | Disposition: A | Discharge status: Home / Self Care | Payer: Self-pay | Source: Home / Self Care | Attending: Orthopedic Surgery

## 2019-03-10 ENCOUNTER — Other Ambulatory Visit: Payer: Self-pay

## 2019-03-10 ENCOUNTER — Inpatient Hospital Stay (HOSPITAL_COMMUNITY): Payer: Medicare Other | Admitting: Anesthesiology

## 2019-03-10 ENCOUNTER — Inpatient Hospital Stay (HOSPITAL_COMMUNITY): Payer: Medicare Other | Admitting: Physician Assistant

## 2019-03-10 DIAGNOSIS — F329 Major depressive disorder, single episode, unspecified: Secondary | ICD-10-CM | POA: Insufficient documentation

## 2019-03-10 DIAGNOSIS — G8918 Other acute postprocedural pain: Secondary | ICD-10-CM | POA: Diagnosis not present

## 2019-03-10 DIAGNOSIS — Z79899 Other long term (current) drug therapy: Secondary | ICD-10-CM | POA: Insufficient documentation

## 2019-03-10 DIAGNOSIS — F411 Generalized anxiety disorder: Secondary | ICD-10-CM | POA: Insufficient documentation

## 2019-03-10 DIAGNOSIS — D649 Anemia, unspecified: Secondary | ICD-10-CM | POA: Diagnosis not present

## 2019-03-10 DIAGNOSIS — Z20828 Contact with and (suspected) exposure to other viral communicable diseases: Secondary | ICD-10-CM | POA: Diagnosis not present

## 2019-03-10 DIAGNOSIS — I2510 Atherosclerotic heart disease of native coronary artery without angina pectoris: Secondary | ICD-10-CM | POA: Insufficient documentation

## 2019-03-10 DIAGNOSIS — M1710 Unilateral primary osteoarthritis, unspecified knee: Secondary | ICD-10-CM | POA: Diagnosis present

## 2019-03-10 DIAGNOSIS — M8580 Other specified disorders of bone density and structure, unspecified site: Secondary | ICD-10-CM | POA: Diagnosis not present

## 2019-03-10 DIAGNOSIS — M1711 Unilateral primary osteoarthritis, right knee: Principal | ICD-10-CM | POA: Insufficient documentation

## 2019-03-10 DIAGNOSIS — M179 Osteoarthritis of knee, unspecified: Secondary | ICD-10-CM | POA: Diagnosis present

## 2019-03-10 DIAGNOSIS — F418 Other specified anxiety disorders: Secondary | ICD-10-CM | POA: Diagnosis not present

## 2019-03-10 DIAGNOSIS — E119 Type 2 diabetes mellitus without complications: Secondary | ICD-10-CM | POA: Diagnosis not present

## 2019-03-10 DIAGNOSIS — F419 Anxiety disorder, unspecified: Secondary | ICD-10-CM | POA: Insufficient documentation

## 2019-03-10 HISTORY — PX: TOTAL KNEE ARTHROPLASTY: SHX125

## 2019-03-10 LAB — TYPE AND SCREEN
ABO/RH(D): O POS
Antibody Screen: NEGATIVE

## 2019-03-10 SURGERY — ARTHROPLASTY, KNEE, TOTAL
Anesthesia: Spinal | Laterality: Right

## 2019-03-10 MED ORDER — ONDANSETRON HCL 4 MG/2ML IJ SOLN: 4.0000 mg | Freq: Once | INTRAMUSCULAR | Status: DC | PRN

## 2019-03-10 MED ORDER — POVIDONE-IODINE 10 % EX SWAB
2.0000 "application " | Freq: Once | CUTANEOUS | Status: AC
  Administered 2019-03-10: 2 via TOPICAL

## 2019-03-10 MED ORDER — SODIUM CHLORIDE 0.9 % IR SOLN
Status: DC | PRN
  Administered 2019-03-10 (×2): 1000 mL

## 2019-03-10 MED ORDER — ACETAMINOPHEN 325 MG PO TABS: 325.0000 mg | ORAL_TABLET | ORAL | Status: DC | PRN

## 2019-03-10 MED ORDER — METHOCARBAMOL 500 MG IVPB - SIMPLE MED
500.0000 mg | Freq: Four times a day (QID) | INTRAVENOUS | Status: DC | PRN
  Filled 2019-03-10: qty 50

## 2019-03-10 MED ORDER — TRAMADOL HCL 50 MG PO TABS
50.0000 mg | ORAL_TABLET | Freq: Four times a day (QID) | ORAL | Status: DC | PRN
  Administered 2019-03-10 – 2019-03-11 (×3): 100 mg via ORAL
  Filled 2019-03-10 (×3): qty 2

## 2019-03-10 MED ORDER — PHENOL 1.4 % MT LIQD
1.0000 | OROMUCOSAL | Status: DC | PRN
  Filled 2019-03-10: qty 177

## 2019-03-10 MED ORDER — DEXAMETHASONE SODIUM PHOSPHATE 10 MG/ML IJ SOLN
INTRAMUSCULAR | Status: AC
  Filled 2019-03-10: qty 1

## 2019-03-10 MED ORDER — LACTATED RINGERS IV SOLN
INTRAVENOUS | Status: DC
  Administered 2019-03-10 (×2): via INTRAVENOUS

## 2019-03-10 MED ORDER — CLONIDINE HCL (ANALGESIA) 100 MCG/ML EP SOLN
EPIDURAL | Status: DC | PRN
  Administered 2019-03-10: 100 ug

## 2019-03-10 MED ORDER — PHENYLEPHRINE 40 MCG/ML (10ML) SYRINGE FOR IV PUSH (FOR BLOOD PRESSURE SUPPORT)
PREFILLED_SYRINGE | INTRAVENOUS | Status: DC | PRN
  Administered 2019-03-10: 40 ug via INTRAVENOUS

## 2019-03-10 MED ORDER — ONDANSETRON HCL 4 MG/2ML IJ SOLN
INTRAMUSCULAR | Status: DC | PRN
  Administered 2019-03-10: 4 mg via INTRAVENOUS

## 2019-03-10 MED ORDER — ROPIVACAINE HCL 7.5 MG/ML IJ SOLN
INTRAMUSCULAR | Status: DC | PRN
  Administered 2019-03-10: 25 mL via PERINEURAL

## 2019-03-10 MED ORDER — SODIUM CHLORIDE (PF) 0.9 % IJ SOLN
INTRAMUSCULAR | Status: AC
  Filled 2019-03-10: qty 10

## 2019-03-10 MED ORDER — OXYCODONE HCL 5 MG PO TABS
5.0000 mg | ORAL_TABLET | ORAL | Status: DC | PRN
  Administered 2019-03-11 (×2): 5 mg via ORAL
  Filled 2019-03-10 (×2): qty 1

## 2019-03-10 MED ORDER — TRANEXAMIC ACID-NACL 1000-0.7 MG/100ML-% IV SOLN
1000.0000 mg | Freq: Once | INTRAVENOUS | Status: AC
  Administered 2019-03-10: 1000 mg via INTRAVENOUS
  Filled 2019-03-10: qty 100

## 2019-03-10 MED ORDER — DEXAMETHASONE SODIUM PHOSPHATE 10 MG/ML IJ SOLN
8.0000 mg | Freq: Once | INTRAMUSCULAR | Status: AC
  Administered 2019-03-10: 10:00:00 10 mg via INTRAVENOUS

## 2019-03-10 MED ORDER — ACETAMINOPHEN 10 MG/ML IV SOLN
1000.0000 mg | Freq: Four times a day (QID) | INTRAVENOUS | Status: DC
  Administered 2019-03-10: 1000 mg via INTRAVENOUS
  Filled 2019-03-10: qty 100

## 2019-03-10 MED ORDER — CEFAZOLIN SODIUM-DEXTROSE 2-4 GM/100ML-% IV SOLN
2.0000 g | INTRAVENOUS | Status: AC
  Administered 2019-03-10: 10:00:00 2 g via INTRAVENOUS
  Filled 2019-03-10: qty 100

## 2019-03-10 MED ORDER — MORPHINE SULFATE (PF) 2 MG/ML IV SOLN: 0.5000 mg | INTRAVENOUS | Status: DC | PRN

## 2019-03-10 MED ORDER — PROPOFOL 500 MG/50ML IV EMUL
INTRAVENOUS | Status: DC | PRN
  Administered 2019-03-10: 50 ug/kg/min via INTRAVENOUS

## 2019-03-10 MED ORDER — TRANEXAMIC ACID-NACL 1000-0.7 MG/100ML-% IV SOLN
1000.0000 mg | INTRAVENOUS | Status: AC
  Administered 2019-03-10: 10:00:00 1000 mg via INTRAVENOUS
  Filled 2019-03-10: qty 100

## 2019-03-10 MED ORDER — BUPIVACAINE LIPOSOME 1.3 % IJ SUSP
INTRAMUSCULAR | Status: DC | PRN
  Administered 2019-03-10: 20 mL

## 2019-03-10 MED ORDER — SODIUM CHLORIDE (PF) 0.9 % IJ SOLN
INTRAMUSCULAR | Status: DC | PRN
  Administered 2019-03-10: 60 mL

## 2019-03-10 MED ORDER — SODIUM CHLORIDE 0.9 % IV SOLN
INTRAVENOUS | Status: DC
  Administered 2019-03-10: 13:00:00 via INTRAVENOUS

## 2019-03-10 MED ORDER — PROPOFOL 10 MG/ML IV BOLUS
INTRAVENOUS | Status: AC
  Filled 2019-03-10: qty 60

## 2019-03-10 MED ORDER — METOCLOPRAMIDE HCL 5 MG PO TABS: 5.0000 mg | ORAL_TABLET | Freq: Three times a day (TID) | ORAL | Status: DC | PRN

## 2019-03-10 MED ORDER — METOCLOPRAMIDE HCL 5 MG/ML IJ SOLN: 5.0000 mg | Freq: Three times a day (TID) | INTRAMUSCULAR | Status: DC | PRN

## 2019-03-10 MED ORDER — OXYCODONE HCL 5 MG/5ML PO SOLN: 5.0000 mg | Freq: Once | ORAL | Status: DC | PRN

## 2019-03-10 MED ORDER — ASPIRIN EC 325 MG PO TBEC
325.0000 mg | DELAYED_RELEASE_TABLET | Freq: Two times a day (BID) | ORAL | Status: DC
  Administered 2019-03-11: 325 mg via ORAL
  Filled 2019-03-10: qty 1

## 2019-03-10 MED ORDER — PHENYLEPHRINE 40 MCG/ML (10ML) SYRINGE FOR IV PUSH (FOR BLOOD PRESSURE SUPPORT)
PREFILLED_SYRINGE | INTRAVENOUS | Status: AC
  Filled 2019-03-10: qty 10

## 2019-03-10 MED ORDER — MEPERIDINE HCL 50 MG/ML IJ SOLN: 6.2500 mg | INTRAMUSCULAR | Status: DC | PRN

## 2019-03-10 MED ORDER — POLYETHYLENE GLYCOL 3350 17 G PO PACK: 17.0000 g | PACK | Freq: Every day | ORAL | Status: DC | PRN

## 2019-03-10 MED ORDER — ONDANSETRON HCL 4 MG/2ML IJ SOLN
INTRAMUSCULAR | Status: AC
  Filled 2019-03-10: qty 2

## 2019-03-10 MED ORDER — METHOCARBAMOL 500 MG PO TABS
500.0000 mg | ORAL_TABLET | Freq: Four times a day (QID) | ORAL | Status: DC | PRN
  Administered 2019-03-10 – 2019-03-11 (×3): 500 mg via ORAL
  Filled 2019-03-10 (×3): qty 1

## 2019-03-10 MED ORDER — EPHEDRINE 5 MG/ML INJ
INTRAVENOUS | Status: AC
  Filled 2019-03-10: qty 10

## 2019-03-10 MED ORDER — EPHEDRINE SULFATE-NACL 50-0.9 MG/10ML-% IV SOSY
PREFILLED_SYRINGE | INTRAVENOUS | Status: DC | PRN
  Administered 2019-03-10: 10 mg via INTRAVENOUS
  Administered 2019-03-10 (×2): 5 mg via INTRAVENOUS
  Administered 2019-03-10: 10 mg via INTRAVENOUS
  Administered 2019-03-10: 5 mg via INTRAVENOUS

## 2019-03-10 MED ORDER — ONDANSETRON HCL 4 MG/2ML IJ SOLN: 4.0000 mg | Freq: Four times a day (QID) | INTRAMUSCULAR | Status: DC | PRN

## 2019-03-10 MED ORDER — FLEET ENEMA 7-19 GM/118ML RE ENEM: 1.0000 | ENEMA | Freq: Once | RECTAL | Status: DC | PRN

## 2019-03-10 MED ORDER — ACETAMINOPHEN 500 MG PO TABS
1000.0000 mg | ORAL_TABLET | Freq: Four times a day (QID) | ORAL | Status: DC
  Administered 2019-03-10 – 2019-03-11 (×3): 1000 mg via ORAL
  Filled 2019-03-10 (×3): qty 2

## 2019-03-10 MED ORDER — DOCUSATE SODIUM 100 MG PO CAPS
100.0000 mg | ORAL_CAPSULE | Freq: Two times a day (BID) | ORAL | Status: DC
  Administered 2019-03-10 – 2019-03-11 (×2): 100 mg via ORAL
  Filled 2019-03-10 (×2): qty 1

## 2019-03-10 MED ORDER — DEXAMETHASONE SODIUM PHOSPHATE 10 MG/ML IJ SOLN
10.0000 mg | Freq: Once | INTRAMUSCULAR | Status: AC
  Administered 2019-03-11: 10 mg via INTRAVENOUS
  Filled 2019-03-10: qty 1

## 2019-03-10 MED ORDER — FENTANYL CITRATE (PF) 100 MCG/2ML IJ SOLN: 25.0000 ug | INTRAMUSCULAR | Status: DC | PRN

## 2019-03-10 MED ORDER — ONDANSETRON HCL 4 MG PO TABS: 4.0000 mg | ORAL_TABLET | Freq: Four times a day (QID) | ORAL | Status: DC | PRN

## 2019-03-10 MED ORDER — MIDAZOLAM HCL 2 MG/2ML IJ SOLN
1.0000 mg | INTRAMUSCULAR | Status: DC
  Administered 2019-03-10: 2 mg via INTRAVENOUS
  Filled 2019-03-10: qty 2

## 2019-03-10 MED ORDER — PROPOFOL 10 MG/ML IV BOLUS
INTRAVENOUS | Status: DC | PRN
  Administered 2019-03-10: 20 mg via INTRAVENOUS
  Administered 2019-03-10 (×2): 30 mg via INTRAVENOUS

## 2019-03-10 MED ORDER — MENTHOL 3 MG MT LOZG: 1.0000 | LOZENGE | OROMUCOSAL | Status: DC | PRN

## 2019-03-10 MED ORDER — CHLORHEXIDINE GLUCONATE 4 % EX LIQD: 60.0000 mL | Freq: Once | CUTANEOUS | Status: DC

## 2019-03-10 MED ORDER — GABAPENTIN 300 MG PO CAPS
300.0000 mg | ORAL_CAPSULE | Freq: Three times a day (TID) | ORAL | Status: DC
  Administered 2019-03-10 – 2019-03-11 (×3): 300 mg via ORAL
  Filled 2019-03-10 (×3): qty 1

## 2019-03-10 MED ORDER — SODIUM CHLORIDE (PF) 0.9 % IJ SOLN
INTRAMUSCULAR | Status: AC
  Filled 2019-03-10: qty 50

## 2019-03-10 MED ORDER — CEFAZOLIN SODIUM-DEXTROSE 2-4 GM/100ML-% IV SOLN
2.0000 g | Freq: Four times a day (QID) | INTRAVENOUS | Status: AC
  Administered 2019-03-10 (×2): 2 g via INTRAVENOUS
  Filled 2019-03-10 (×2): qty 100

## 2019-03-10 MED ORDER — ALPRAZOLAM 0.5 MG PO TABS: 0.5000 mg | ORAL_TABLET | Freq: Two times a day (BID) | ORAL | Status: DC | PRN

## 2019-03-10 MED ORDER — OXYCODONE HCL 5 MG PO TABS: 5.0000 mg | ORAL_TABLET | Freq: Once | ORAL | Status: DC | PRN

## 2019-03-10 MED ORDER — BUPROPION HCL ER (SR) 150 MG PO TB12
150.0000 mg | ORAL_TABLET | Freq: Two times a day (BID) | ORAL | Status: DC
  Filled 2019-03-10 (×2): qty 1

## 2019-03-10 MED ORDER — DIPHENHYDRAMINE HCL 12.5 MG/5ML PO ELIX: 12.5000 mg | ORAL_SOLUTION | ORAL | Status: DC | PRN

## 2019-03-10 MED ORDER — ACETAMINOPHEN 160 MG/5ML PO SOLN: 325.0000 mg | ORAL | Status: DC | PRN

## 2019-03-10 MED ORDER — BISACODYL 10 MG RE SUPP: 10.0000 mg | Freq: Every day | RECTAL | Status: DC | PRN

## 2019-03-10 MED ORDER — FENTANYL CITRATE (PF) 100 MCG/2ML IJ SOLN
50.0000 ug | INTRAMUSCULAR | Status: DC
  Administered 2019-03-10: 50 ug via INTRAVENOUS
  Filled 2019-03-10: qty 2

## 2019-03-10 SURGICAL SUPPLY — 59 items
ATTUNE PS FEM RT SZ 5 CEM KNEE (Femur) ×2 IMPLANT
ATTUNE PSRP INSR SZ5 8 KNEE (Insert) ×2 IMPLANT
BAG ZIPLOCK 12X15 (MISCELLANEOUS) ×2 IMPLANT
BASE TIBIAL ROT PLAT SZ 5 KNEE (Knees) ×1 IMPLANT
BLADE SAG 18X100X1.27 (BLADE) ×2 IMPLANT
BLADE SAW SGTL 11.0X1.19X90.0M (BLADE) ×2 IMPLANT
BLADE SURG SZ10 CARB STEEL (BLADE) ×4 IMPLANT
BNDG ELASTIC 6X15 VLCR STRL LF (GAUZE/BANDAGES/DRESSINGS) ×2 IMPLANT
BNDG ELASTIC 6X5.8 VLCR STR LF (GAUZE/BANDAGES/DRESSINGS) ×2 IMPLANT
BOWL SMART MIX CTS (DISPOSABLE) ×2 IMPLANT
CEMENT HV SMART SET (Cement) ×4 IMPLANT
COVER SURGICAL LIGHT HANDLE (MISCELLANEOUS) ×2 IMPLANT
COVER WAND RF STERILE (DRAPES) IMPLANT
CUFF TOURN SGL QUICK 34 (TOURNIQUET CUFF) ×1
CUFF TRNQT CYL 34X4.125X (TOURNIQUET CUFF) ×1 IMPLANT
DECANTER SPIKE VIAL GLASS SM (MISCELLANEOUS) ×4 IMPLANT
DRAPE U-SHAPE 47X51 STRL (DRAPES) ×2 IMPLANT
DRSG ADAPTIC 3X8 NADH LF (GAUZE/BANDAGES/DRESSINGS) ×2 IMPLANT
DRSG PAD ABDOMINAL 8X10 ST (GAUZE/BANDAGES/DRESSINGS) ×2 IMPLANT
DURAPREP 26ML APPLICATOR (WOUND CARE) ×2 IMPLANT
ELECT REM PT RETURN 15FT ADLT (MISCELLANEOUS) ×2 IMPLANT
EVACUATOR 1/8 PVC DRAIN (DRAIN) ×2 IMPLANT
GAUZE SPONGE 4X4 12PLY STRL (GAUZE/BANDAGES/DRESSINGS) ×2 IMPLANT
GLOVE BIO SURGEON STRL SZ7 (GLOVE) ×2 IMPLANT
GLOVE BIO SURGEON STRL SZ8 (GLOVE) ×2 IMPLANT
GLOVE BIOGEL PI IND STRL 6.5 (GLOVE) ×1 IMPLANT
GLOVE BIOGEL PI IND STRL 7.0 (GLOVE) ×1 IMPLANT
GLOVE BIOGEL PI IND STRL 8 (GLOVE) ×1 IMPLANT
GLOVE BIOGEL PI INDICATOR 6.5 (GLOVE) ×1
GLOVE BIOGEL PI INDICATOR 7.0 (GLOVE) ×1
GLOVE BIOGEL PI INDICATOR 8 (GLOVE) ×1
GLOVE SURG SS PI 6.5 STRL IVOR (GLOVE) ×2 IMPLANT
GOWN STRL REUS W/TWL LRG LVL3 (GOWN DISPOSABLE) ×6 IMPLANT
HANDPIECE INTERPULSE COAX TIP (DISPOSABLE) ×1
HOLDER FOLEY CATH W/STRAP (MISCELLANEOUS) ×2 IMPLANT
IMMOBILIZER KNEE 20 (SOFTGOODS) ×2
IMMOBILIZER KNEE 20 THIGH 36 (SOFTGOODS) ×1 IMPLANT
KIT TURNOVER KIT A (KITS) IMPLANT
MANIFOLD NEPTUNE II (INSTRUMENTS) ×2 IMPLANT
NS IRRIG 1000ML POUR BTL (IV SOLUTION) ×2 IMPLANT
PACK TOTAL KNEE CUSTOM (KITS) ×2 IMPLANT
PADDING CAST COTTON 6X4 STRL (CAST SUPPLIES) ×4 IMPLANT
PATELLA MEDIAL ATTUN 35MM KNEE (Knees) ×2 IMPLANT
PIN STEINMAN FIXATION KNEE (PIN) ×2 IMPLANT
PIN THREADED HEADED SIGMA (PIN) ×2 IMPLANT
PROTECTOR NERVE ULNAR (MISCELLANEOUS) ×2 IMPLANT
SET HNDPC FAN SPRY TIP SCT (DISPOSABLE) ×1 IMPLANT
STRIP CLOSURE SKIN 1/2X4 (GAUZE/BANDAGES/DRESSINGS) ×4 IMPLANT
SUT MNCRL AB 4-0 PS2 18 (SUTURE) ×2 IMPLANT
SUT STRATAFIX 0 PDS 27 VIOLET (SUTURE) ×2
SUT VIC AB 2-0 CT1 27 (SUTURE) ×3
SUT VIC AB 2-0 CT1 TAPERPNT 27 (SUTURE) ×3 IMPLANT
SUTURE STRATFX 0 PDS 27 VIOLET (SUTURE) ×1 IMPLANT
TIBIAL BASE ROT PLAT SZ 5 KNEE (Knees) ×2 IMPLANT
TRAY CATH 16FR W/PLASTIC CATH (SET/KITS/TRAYS/PACK) ×2 IMPLANT
TRAY FOLEY MTR SLVR 16FR STAT (SET/KITS/TRAYS/PACK) IMPLANT
WATER STERILE IRR 1000ML POUR (IV SOLUTION) ×4 IMPLANT
WRAP KNEE MAXI GEL POST OP (GAUZE/BANDAGES/DRESSINGS) ×2 IMPLANT
YANKAUER SUCT BULB TIP 10FT TU (MISCELLANEOUS) ×2 IMPLANT

## 2019-03-10 NOTE — Discharge Instructions (Signed)
° °Dr. Frank Aluisio °[facility]ist °[facility] °[address] °[place], [place] [address] °[phone] ° °TOTAL KNEE REPLACEMENT POSTOPERATIVE DIRECTIONS ° °Knee Rehabilitation, Guidelines Following Surgery  °Results after knee surgery are often greatly improved when you follow the exercise, range of motion and muscle strengthening exercises prescribed by your doctor. Safety measures are also important to protect the knee from further injury. Any time any of these exercises cause you to have increased pain or swelling in your knee joint, decrease the amount until you are comfortable again and slowly increase them. If you have problems or questions, call your caregiver or physical therapist for advice.  ° °HOME CARE INSTRUCTIONS  °• Remove items at home which could result in a fall. This includes throw rugs or furniture in walking pathways.  °· ICE to the affected knee every three hours for 30 minutes at a time and then as needed for pain and swelling.  Continue to use ice on the knee for pain and swelling from surgery. You may notice swelling that will progress down to the foot and ankle.  This is normal after surgery.  Elevate the leg when you are not up walking on it.   °· Continue to use the breathing machine which will help keep your temperature down.  It is common for your temperature to cycle up and down following surgery, especially at night when you are not up moving around and exerting yourself.  The breathing machine keeps your lungs expanded and your temperature down. °· Do not place pillow under knee, focus on keeping the knee straight while resting ° °DIET °You may resume your previous home diet once your are discharged from the hospital. ° °DRESSING / WOUND CARE / SHOWERING °You may shower 3 days after surgery, but keep the wounds dry during showering.  You may use an occlusive plastic wrap (Press'n Seal for example), NO SOAKING/SUBMERGING IN THE BATHTUB.  If the bandage  gets wet, change with a clean dry gauze.  If the incision gets wet, pat the wound dry with a clean towel. °You may start showering once you are discharged home but do not submerge the incision under water. Just pat the incision dry and apply a dry gauze dressing on daily. °Change the surgical dressing daily and reapply a dry dressing each time. ° °ACTIVITY °Walk with your walker as instructed. °Use walker as long as suggested by your caregivers. °Avoid periods of inactivity such as sitting longer than an hour when not asleep. This helps prevent blood clots.  °You may resume a sexual relationship in one month or when given the OK by your doctor.  °You may return to work once you are cleared by your doctor.  °Do not drive a car for 6 weeks or until released by you surgeon.  °Do not drive while taking narcotics. ° °WEIGHT BEARING °Weight bearing as tolerated with assist device (walker, cane, etc) as directed, use it as long as suggested by your surgeon or therapist, typically at least 4-6 weeks. ° °POSTOPERATIVE CONSTIPATION PROTOCOL °Constipation - defined medically as fewer than three stools per week and severe constipation as less than one stool per week. ° °One of the most common issues patients have following surgery is constipation.  Even if you have a regular bowel pattern at home, your normal regimen is likely to be disrupted due to multiple reasons following surgery.  Combination of anesthesia, postoperative narcotics, change in appetite and fluid intake all can affect your bowels.    In order to avoid complications following surgery, here are some recommendations in order to help you during your recovery period. ° °Colace (docusate) - Pick up an over-the-counter form of Colace or another stool softener and take twice a day as long as you are requiring postoperative pain medications.  Take with a full glass of water daily.  If you experience loose stools or diarrhea, hold the colace until you stool forms back  up.  If your symptoms do not get better within 1 week or if they get worse, check with your doctor. ° °Dulcolax (bisacodyl) - Pick up over-the-counter and take as directed by the product packaging as needed to assist with the movement of your bowels.  Take with a full glass of water.  Use this product as needed if not relieved by Colace only.  ° °MiraLax (polyethylene glycol) - Pick up over-the-counter to have on hand.  MiraLax is a solution that will increase the amount of water in your bowels to assist with bowel movements.  Take as directed and can mix with a glass of water, juice, soda, coffee, or tea.  Take if you go more than two days without a movement. °Do not use MiraLax more than once per day. Call your doctor if you are still constipated or irregular after using this medication for 7 days in a row. ° °If you continue to have problems with postoperative constipation, please contact the office for further assistance and recommendations.  If you experience "the worst abdominal pain ever" or develop nausea or vomiting, please contact the office immediatly for further recommendations for treatment. ° °ITCHING ° If you experience itching with your medications, try taking only a single pain pill, or even half a pain pill at a time.  You can also use Benadryl over the counter for itching or also to help with sleep.  ° °TED HOSE STOCKINGS °Wear the elastic stockings on both legs for three weeks following surgery during the day but you may remove then at night for sleeping. ° °MEDICATIONS °See your medication summary on the “After Visit Summary” that the nursing staff will review with you prior to discharge.  You may have some home medications which will be placed on hold until you complete the course of blood thinner medication.  It is important for you to complete the blood thinner medication as prescribed by your surgeon.  Continue your approved medications as instructed at time of discharge. ° °PRECAUTIONS °If  you experience chest pain or shortness of breath - call 911 immediately for transfer to the hospital emergency department.  °If you develop a fever greater that 101 F, purulent drainage from wound, increased redness or drainage from wound, foul odor from the wound/dressing, or calf pain - CONTACT YOUR SURGEON.   °                                                °FOLLOW-UP APPOINTMENTS °Make sure you keep all of your appointments after your operation with your surgeon and caregivers. You should call the office at the above phone number and make an appointment for approximately two weeks after the date of your surgery or on the date instructed by your surgeon outlined in the "After Visit Summary". ° ° °RANGE OF MOTION AND STRENGTHENING EXERCISES  °Rehabilitation of the knee is important following a knee injury or   an operation. After just a few days of immobilization, the muscles of the thigh which control the knee become weakened and shrink (atrophy). Knee exercises are designed to build up the tone and strength of the thigh muscles and to improve knee motion. Often times heat used for twenty to thirty minutes before working out will loosen up your tissues and help with improving the range of motion but do not use heat for the first two weeks following surgery. These exercises can be done on a training (exercise) mat, on the floor, on a table or on a bed. Use what ever works the best and is most comfortable for you Knee exercises include:  °• Leg Lifts - While your knee is still immobilized in a splint or cast, you can do straight leg raises. Lift the leg to 60 degrees, hold for 3 sec, and slowly lower the leg. Repeat 10-20 times 2-3 times daily. Perform this exercise against resistance later as your knee gets better.  °• Quad and Hamstring Sets - Tighten up the muscle on the front of the thigh (Quad) and hold for 5-10 sec. Repeat this 10-20 times hourly. Hamstring sets are done by pushing the foot backward against an  object and holding for 5-10 sec. Repeat as with quad sets.  °· Leg Slides: Lying on your back, slowly slide your foot toward your buttocks, bending your knee up off the floor (only go as far as is comfortable). Then slowly slide your foot back down until your leg is flat on the floor again. °· Angel Wings: Lying on your back spread your legs to the side as far apart as you can without causing discomfort.  °A rehabilitation program following serious knee injuries can speed recovery and prevent re-injury in the future due to weakened muscles. Contact your doctor or a physical therapist for more information on knee rehabilitation.  ° °IF YOU ARE TRANSFERRED TO A SKILLED REHAB FACILITY °If the patient is transferred to a skilled rehab facility following release from the hospital, a list of the current medications will be sent to the facility for the patient to continue.  When discharged from the skilled rehab facility, please have the facility set up the patient's Home Health Physical Therapy prior to being released. Also, the skilled facility will be responsible for providing the patient with their medications at time of release from the facility to include their pain medication, the muscle relaxants, and their blood thinner medication. If the patient is still at the rehab facility at time of the two week follow up appointment, the skilled rehab facility will also need to assist the patient in arranging follow up appointment in our office and any transportation needs. ° °MAKE SURE YOU:  °• Understand these instructions.  °• Get help right away if you are not doing well or get worse.  ° ° °Pick up stool softner and laxative for home use following surgery while on pain medications. °Do not submerge incision under water. °Please use good hand washing techniques while changing dressing each day. °May shower starting three days after surgery. °Please use a clean towel to pat the incision dry following showers. °Continue to  use ice for pain and swelling after surgery. °Do not use any lotions or creams on the incision until instructed by your surgeon. ° °

## 2019-03-10 NOTE — Op Note (Signed)
OPERATIVE REPORT-TOTAL KNEE ARTHROPLASTY   Pre-operative diagnosis- Osteoarthritis  Right knee(s)  Post-operative diagnosis- Osteoarthritis Right knee(s)  Procedure-  Right  Total Knee Arthroplasty  Surgeon- Dione Plover. Brenisha Tsui, MD  Assistant- Ardeen Jourdain, PA-C   Anesthesia-  Adductor canal block and spinal  EBL-25 mL   Drains Hemovac  Tourniquet time-  Total Tourniquet Time Documented: Thigh (Left) - 33 minutes Total: Thigh (Left) - 33 minutes     Complications- None  Condition-PACU - hemodynamically stable.   Brief Clinical Note  Tiffany Perry is a 68 y.o. year old female with end stage OA of her right knee with progressively worsening pain and dysfunction. She has constant pain, with activity and at rest and significant functional deficits with difficulties even with ADLs. She has had extensive non-op management including analgesics, injections of cortisone and viscosupplements, and home exercise program, but remains in significant pain with significant dysfunction.Radiographs show bone on bone arthritis medial and patellofemoral. She presents now for right Total Knee Arthroplasty.    Procedure in detail---   The patient is brought into the operating room and positioned supine on the operating table. After successful administration of  Adductor canal block and spinal,   a tourniquet is placed high on the  Right thigh(s) and the lower extremity is prepped and draped in the usual sterile fashion. Time out is performed by the operating team and then the  Right lower extremity is wrapped in Esmarch, knee flexed and the tourniquet inflated to 300 mmHg.       A midline incision is made with a ten blade through the subcutaneous tissue to the level of the extensor mechanism. A fresh blade is used to make a medial parapatellar arthrotomy. Soft tissue over the proximal medial tibia is subperiosteally elevated to the joint line with a knife and into the semimembranosus bursa  with a Cobb elevator. Soft tissue over the proximal lateral tibia is elevated with attention being paid to avoiding the patellar tendon on the tibial tubercle. The patella is everted, knee flexed 90 degrees and the ACL and PCL are removed. Findings are bone on bone medial and patellofemoral with large global osteophytes.        The drill is used to create a starting hole in the distal femur and the canal is thoroughly irrigated with sterile saline to remove the fatty contents. The 5 degree Right  valgus alignment guide is placed into the femoral canal and the distal femoral cutting block is pinned to remove 9 mm off the distal femur. Resection is made with an oscillating saw.      The tibia is subluxed forward and the menisci are removed. The extramedullary alignment guide is placed referencing proximally at the medial aspect of the tibial tubercle and distally along the second metatarsal axis and tibial crest. The block is pinned to remove 39mm off the more deficient medial  side. Resection is made with an oscillating saw. Size 5is the most appropriate size for the tibia and the proximal tibia is prepared with the modular drill and keel punch for that size.      The femoral sizing guide is placed and size 5 is most appropriate. Rotation is marked off the epicondylar axis and confirmed by creating a rectangular flexion gap at 90 degrees. The size 5 cutting block is pinned in this rotation and the anterior, posterior and chamfer cuts are made with the oscillating saw. The intercondylar block is then placed and that cut is made.  Trial size 5 tibial component, trial size 5 posterior stabilized femur and a 8  mm posterior stabilized rotating platform insert trial is placed. Full extension is achieved with excellent varus/valgus and anterior/posterior balance throughout full range of motion. The patella is everted and thickness measured to be 22  mm. Free hand resection is taken to 12 mm, a 35 template is placed,  lug holes are drilled, trial patella is placed, and it tracks normally. Osteophytes are removed off the posterior femur with the trial in place. All trials are removed and the cut bone surfaces prepared with pulsatile lavage. Cement is mixed and once ready for implantation, the size 5 tibial implant, size  5 posterior stabilized femoral component, and the size 35 patella are cemented in place and the patella is held with the clamp. The trial insert is placed and the knee held in full extension. The Exparel (20 ml mixed with 60 ml saline) is injected into the extensor mechanism, posterior capsule, medial and lateral gutters and subcutaneous tissues.  All extruded cement is removed and once the cement is hard the permanent 8 mm posterior stabilized rotating platform insert is placed into the tibial tray.      The wound is copiously irrigated with saline solution and the extensor mechanism closed over a hemovac drain with #1 V-loc suture. The tourniquet is released for a total tourniquet time of 33  minutes. Flexion against gravity is 140 degrees and the patella tracks normally. Subcutaneous tissue is closed with 2.0 vicryl and subcuticular with running 4.0 Monocryl. The incision is cleaned and dried and steri-strips and a bulky sterile dressing are applied. The limb is placed into a knee immobilizer and the patient is awakened and transported to recovery in stable condition.      Please note that a surgical assistant was a medical necessity for this procedure in order to perform it in a safe and expeditious manner. Surgical assistant was necessary to retract the ligaments and vital neurovascular structures to prevent injury to them and also necessary for proper positioning of the limb to allow for anatomic placement of the prosthesis.   Dione Plover Kamariya Blevens, MD    03/10/2019, 10:33 AM

## 2019-03-10 NOTE — Anesthesia Preprocedure Evaluation (Signed)
Anesthesia Evaluation  Patient identified by MRN, date of birth, ID band Patient awake    Reviewed: Allergy & Precautions, H&P , NPO status , Patient's Chart, lab work & pertinent test results, reviewed documented beta blocker date and time   Airway Mallampati: II  TM Distance: >3 FB Neck ROM: full    Dental no notable dental hx.    Pulmonary neg pulmonary ROS,    Pulmonary exam normal breath sounds clear to auscultation       Cardiovascular Exercise Tolerance: Good negative cardio ROS   Rhythm:regular Rate:Normal     Neuro/Psych PSYCHIATRIC DISORDERS Anxiety Depression negative neurological ROS     GI/Hepatic negative GI ROS, Neg liver ROS,   Endo/Other  negative endocrine ROS  Renal/GU negative Renal ROS  negative genitourinary   Musculoskeletal  (+) Arthritis , Osteoarthritis,    Abdominal   Peds  Hematology negative hematology ROS (+)   Anesthesia Other Findings   Reproductive/Obstetrics negative OB ROS                             Anesthesia Physical Anesthesia Plan  ASA: II  Anesthesia Plan: Spinal   Post-op Pain Management:  Regional for Post-op pain   Induction:   PONV Risk Score and Plan: 2 and Treatment may vary due to age or medical condition  Airway Management Planned: Nasal Cannula and Simple Face Mask  Additional Equipment:   Intra-op Plan:   Post-operative Plan:   Informed Consent: I have reviewed the patients History and Physical, chart, labs and discussed the procedure including the risks, benefits and alternatives for the proposed anesthesia with the patient or authorized representative who has indicated his/her understanding and acceptance.     Dental Advisory Given  Plan Discussed with: CRNA, Anesthesiologist and Surgeon  Anesthesia Plan Comments: (  )        Anesthesia Quick Evaluation

## 2019-03-10 NOTE — Interval H&P Note (Signed)
History and Physical Interval Note:  03/10/2019 7:02 AM  Tiffany Perry  has presented today for surgery, with the diagnosis of right knee osteoarthritis.  The various methods of treatment have been discussed with the patient and family. After consideration of risks, benefits and other options for treatment, the patient has consented to  Procedure(s) with comments: TOTAL KNEE ARTHROPLASTY (Right) - 31min as a surgical intervention.  The patient's history has been reviewed, patient examined, no change in status, stable for surgery.  I have reviewed the patient's chart and labs.  Questions were answered to the patient's satisfaction.     Pilar Plate Delia Slatten

## 2019-03-10 NOTE — Anesthesia Procedure Notes (Signed)
Anesthesia Regional Block: Adductor canal block   Pre-Anesthetic Checklist: ,, timeout performed, Correct Patient, Correct Site, Correct Laterality, Correct Procedure, Correct Position, site marked, Risks and benefits discussed,  Surgical consent,  Pre-op evaluation,  At surgeon's request and post-op pain management  Laterality: Right  Prep: chloraprep       Needles:  Injection technique: Single-shot  Needle Type: Echogenic Stimulator Needle     Needle Length: 5cm  Needle Gauge: 22     Additional Needles:   Procedures:, nerve stimulator,,, ultrasound used (permanent image in chart),,,,  Narrative:  Start time: 03/10/2019 9:08 AM End time: 03/10/2019 9:12 AM Injection made incrementally with aspirations every 5 mL.  Performed by: Personally  Anesthesiologist: Janeece Riggers, MD  Additional Notes: Functioning IV was confirmed and monitors were applied.  A 66mm 22ga Arrow echogenic stimulator needle was used. Sterile prep and drape,hand hygiene and sterile gloves were used. Ultrasound guidance: relevant anatomy identified, needle position confirmed, local anesthetic spread visualized around nerve(s)., vascular puncture avoided.  Image printed for medical record. Negative aspiration and negative test dose prior to incremental administration of local anesthetic. The patient tolerated the procedure well.

## 2019-03-10 NOTE — Anesthesia Postprocedure Evaluation (Signed)
Anesthesia Post Note  Patient: Perl Folmar Dobosz  Procedure(s) Performed: TOTAL KNEE ARTHROPLASTY (Right )     Patient location during evaluation: PACU Anesthesia Type: Spinal Level of consciousness: oriented and awake and alert Pain management: pain level controlled Vital Signs Assessment: post-procedure vital signs reviewed and stable Respiratory status: spontaneous breathing, respiratory function stable and patient connected to nasal cannula oxygen Cardiovascular status: blood pressure returned to baseline and stable Postop Assessment: no headache, no backache and no apparent nausea or vomiting Anesthetic complications: no    Last Vitals:  Vitals:   03/10/19 1154 03/10/19 1303  BP: 108/73 106/66  Pulse: 71 74  Resp: 16 16  Temp: (!) 35.7 C (!) 36.3 C  SpO2: 99% 100%    Last Pain:  Vitals:   03/10/19 1323  TempSrc:   PainSc: 3                  Jackline Castilla

## 2019-03-10 NOTE — Progress Notes (Signed)
AssistedDr. Oddono with right, ultrasound guided, adductor canal block. Side rails up, monitors on throughout procedure. See vital signs in flow sheet. Tolerated Procedure well.  

## 2019-03-10 NOTE — Transfer of Care (Signed)
Immediate Anesthesia Transfer of Care Note  Patient: Tiffany Perry  Procedure(s) Performed: TOTAL KNEE ARTHROPLASTY (Right )  Patient Location: PACU  Anesthesia Type:Regional and Spinal  Level of Consciousness: awake, alert  and oriented  Airway & Oxygen Therapy: Patient Spontanous Breathing and Patient connected to nasal cannula oxygen  Post-op Assessment: Report given to RN and Post -op Vital signs reviewed and stable  Post vital signs: Reviewed and stable  Last Vitals:  Vitals Value Taken Time  BP 95/78 03/10/19 1050  Temp    Pulse 82 03/10/19 1052  Resp 17 03/10/19 1052  SpO2 96 % 03/10/19 1052  Vitals shown include unvalidated device data.  Last Pain:  Vitals:   03/10/19 0921  TempSrc:   PainSc: 0-No pain      Patients Stated Pain Goal: 4 (52/84/13 2440)  Complications: No apparent anesthesia complications

## 2019-03-10 NOTE — Anesthesia Procedure Notes (Signed)
Procedure Name: MAC Date/Time: 03/10/2019 9:34 AM Performed by: Deliah Boston, CRNA Pre-anesthesia Checklist: Patient identified, Emergency Drugs available, Suction available and Patient being monitored Patient Re-evaluated:Patient Re-evaluated prior to induction Oxygen Delivery Method: Nasal cannula Induction Type: IV induction Placement Confirmation: positive ETCO2 and breath sounds checked- equal and bilateral

## 2019-03-10 NOTE — Care Plan (Signed)
Ortho Bundle Case Management Note  Patient Details  Name: Tiffany Perry MRN: [id number] Date of Birth: [birth year]  R TKA on 03-10-19 DCP:  Home with spouse.  2 story home with threshold to enter.  MBR on main.   DME:  No needs.  Has a RW and 3-in-1 PT:  [facility].  PT eval scheduled on 03-13-19.                   DME Arranged:  N/A DME Agency:  NA  HH Arranged:  NA HH Agency:   NA  Additional Comments: Please contact me with any questions of if this plan should need to change.  Marianne Sofia, RN,CCM [facility]  [phone] 03/10/2019, 4:03 PM

## 2019-03-10 NOTE — Evaluation (Signed)
Physical Therapy Evaluation Patient Details Name: Tiffany Perry MRN: [id number] DOB: [birth year] Today's Date: 03/10/2019   History of Present Illness  s/p R TKA  Clinical Impression  Pt is s/p TKA resulting in the deficits listed below (see PT Problem List).  Pt will benefit from skilled PT to increase their independence and safety with mobility to allow discharge to the venue listed below.      Follow Up Recommendations Follow surgeon's recommendation for DC plan and follow-up therapies    Equipment Recommendations  None recommended by PT    Recommendations for Other Services       Precautions / Restrictions Precautions Precautions: Knee;Fall Required Braces or Orthoses: Knee Immobilizer - Right Knee Immobilizer - Right: Discontinue once straight leg raise with < 10 degree lag Restrictions Weight Bearing Restrictions: No RLE Weight Bearing: Weight bearing as tolerated      Mobility  Bed Mobility Overal bed mobility: Needs Assistance Bed Mobility: Supine to Sit     Supine to sit: Supervision     General bed mobility comments: for safety  Transfers Overall transfer level: Needs assistance Equipment used: Rolling walker (2 wheeled) Transfers: Sit to/from Stand Sit to Stand: Min assist;Min guard         General transfer comment: cues for hand placement  Ambulation/Gait Ambulation/Gait assistance: Min assist;Min guard Gait Distance (Feet): 60 Feet Assistive device: Rolling walker (2 wheeled) Gait Pattern/deviations: Step-to pattern     General Gait Details: cues for sequence and RW position  Stairs            Wheelchair Mobility    Modified Rankin (Stroke Patients Only)       Balance                                             Pertinent Vitals/Pain Pain Assessment: 0-10 Pain Score: 3  Pain Location: right knee Pain Descriptors / Indicators: Aching;Grimacing Pain Intervention(s): Limited activity within  patient's tolerance;Monitored during session;Premedicated before session;Repositioned;Ice applied    Home Living Family/patient expects to be discharged to:: Private residence Living Arrangements: Spouse/significant other Available Help at Discharge: Family Type of Home: House Home Access: Stairs to enter   Technical brewer of Steps: small step Home Layout: One level Home Equipment: Environmental consultant - 2 wheels;Shower seat;Bedside commode Additional Comments: very high bed (32")    Prior Function Level of Independence: Independent               Hand Dominance        Extremity/Trunk Assessment   Upper Extremity Assessment Upper Extremity Assessment: Overall WFL for tasks assessed    Lower Extremity Assessment Lower Extremity Assessment: RLE deficits/detail RLE Deficits / Details: ankle WFL;  knee and hip grossly 2+ to 3/5, limited by post op pain and weakness       Communication   Communication: No difficulties  Cognition Arousal/Alertness: Awake/alert Behavior During Therapy: WFL for tasks assessed/performed Overall Cognitive Status: Within Functional Limits for tasks assessed                                        General Comments      Exercises Total Joint Exercises Ankle Circles/Pumps: AROM;Both;10 reps Quad Sets: 5 reps;Both;AROM   Assessment/Plan    PT Assessment Patient needs continued PT  services  PT Problem List Decreased strength;Decreased range of motion;Decreased activity tolerance;Decreased knowledge of use of DME;Decreased mobility;Pain       PT Treatment Interventions DME instruction;Gait training;Functional mobility training;Therapeutic activities;Patient/family education;Therapeutic exercise;Stair training    PT Goals (Current goals can be found in the Care Plan section)  Acute Rehab PT Goals PT Goal Formulation: With patient Time For Goal Achievement: 03/17/19 Potential to Achieve Goals: Good    Frequency 7X/week    Barriers to discharge        Co-evaluation               AM-PAC PT "6 Clicks" Mobility  Outcome Measure Help needed turning from your back to your side while in a flat bed without using bedrails?: A Little Help needed moving from lying on your back to sitting on the side of a flat bed without using bedrails?: A Little   Help needed standing up from a chair using your arms (e.g., wheelchair or bedside chair)?: A Little Help needed to walk in hospital room?: A Little Help needed climbing 3-5 steps with a railing? : A Little 6 Click Score: 15    End of Session Equipment Utilized During Treatment: Gait belt;Right knee immobilizer Activity Tolerance: Patient tolerated treatment well Patient left: in chair;with call bell/phone within reach;with chair alarm set   PT Visit Diagnosis: Difficulty in walking, not elsewhere classified (R26.2)    Time: 0312-8118 PT Time Calculation (min) (ACUTE ONLY): 25 min   Charges:   PT Evaluation $PT Eval Low Complexity: 1 Low PT Treatments $Gait Training: 8-22 mins        Kenyon Ana, PT  Pager: [phone] Acute Rehab Dept [facility]): [phone]   03/10/2019   [facility] 03/10/2019, 4:36 PM

## 2019-03-11 ENCOUNTER — Encounter (HOSPITAL_COMMUNITY): Payer: Self-pay | Admitting: Orthopedic Surgery

## 2019-03-11 DIAGNOSIS — Z79899 Other long term (current) drug therapy: Secondary | ICD-10-CM | POA: Diagnosis not present

## 2019-03-11 DIAGNOSIS — M8580 Other specified disorders of bone density and structure, unspecified site: Secondary | ICD-10-CM | POA: Diagnosis not present

## 2019-03-11 DIAGNOSIS — Z20828 Contact with and (suspected) exposure to other viral communicable diseases: Secondary | ICD-10-CM | POA: Diagnosis not present

## 2019-03-11 DIAGNOSIS — F411 Generalized anxiety disorder: Secondary | ICD-10-CM | POA: Diagnosis not present

## 2019-03-11 DIAGNOSIS — F329 Major depressive disorder, single episode, unspecified: Secondary | ICD-10-CM | POA: Diagnosis not present

## 2019-03-11 DIAGNOSIS — M1711 Unilateral primary osteoarthritis, right knee: Secondary | ICD-10-CM | POA: Diagnosis not present

## 2019-03-11 LAB — BASIC METABOLIC PANEL
Anion gap: 7 (ref 5–15)
BUN: 10 mg/dL (ref 8–23)
CO2: 25 mmol/L (ref 22–32)
Calcium: 8.8 mg/dL — ABNORMAL LOW (ref 8.9–10.3)
Chloride: 108 mmol/L (ref 98–111)
Creatinine, Ser: 0.66 mg/dL (ref 0.44–1.00)
GFR calc Af Amer: >60 mL/min (ref >60)
GFR calc non Af Amer: >60 mL/min (ref >60)
Glucose, Bld: 107 mg/dL — ABNORMAL HIGH (ref 70–99)
Potassium: 4.5 mmol/L (ref 3.5–5.1)
Sodium: 140 mmol/L (ref 135–145)

## 2019-03-11 LAB — CBC
HCT: 37.4 % (ref 36.0–46.0)
Hemoglobin: 11.8 g/dL — ABNORMAL LOW (ref 12.0–15.0)
MCH: 31.1 pg (ref 26.0–34.0)
MCHC: 31.6 g/dL (ref 30.0–36.0)
MCV: 98.7 fL (ref 80.0–100.0)
Platelets: 215 10*3/uL (ref 150–400)
RBC: 3.79 MIL/uL — ABNORMAL LOW (ref 3.87–5.11)
RDW: 11.9 % (ref 11.5–15.5)
WBC: 13.2 10*3/uL — ABNORMAL HIGH (ref 4.0–10.5)
nRBC: 0 % (ref 0.0–0.2)

## 2019-03-11 MED ORDER — TRAMADOL HCL 50 MG PO TABS: 50.0000 mg | ORAL_TABLET | Freq: Four times a day (QID) | ORAL | 0 refills | Status: DC | PRN

## 2019-03-11 MED ORDER — OXYCODONE HCL 5 MG PO TABS: 5.0000 mg | ORAL_TABLET | Freq: Four times a day (QID) | ORAL | 0 refills | Status: DC | PRN

## 2019-03-11 MED ORDER — GABAPENTIN 300 MG PO CAPS: 300.0000 mg | ORAL_CAPSULE | Freq: Three times a day (TID) | ORAL | 0 refills | Status: DC

## 2019-03-11 MED ORDER — ASPIRIN 325 MG PO TBEC: 325.0000 mg | DELAYED_RELEASE_TABLET | Freq: Two times a day (BID) | ORAL | 0 refills | Status: AC

## 2019-03-11 MED ORDER — METHOCARBAMOL 500 MG PO TABS: 500.0000 mg | ORAL_TABLET | Freq: Four times a day (QID) | ORAL | 0 refills | Status: DC | PRN

## 2019-03-11 NOTE — Progress Notes (Signed)
   03/11/19 1400  PT Visit Information  Last PT Received On 03/11/19  Pt progressing well. Exercise focused session and pt tol well; reviewed use of and importance ice at home; pt ready for.t d/c from PT standpoint  Assistance Needed +1  History of Present Illness s/p R TKA  Precautions  Precautions Knee;Fall  Precaution Comments IND SLRs today  Knee Immobilizer - Right Discontinue once straight leg raise with < 10 degree lag  Restrictions  Weight Bearing Restrictions No  RLE Weight Bearing WBAT  Pain Assessment  Pain Assessment 0-10  Pain Score 5  Pain Location right knee  Pain Descriptors / Indicators Aching;Grimacing  Pain Intervention(s) Limited activity within patient's tolerance;Monitored during session;Ice applied;RN gave pain meds during session  Cognition  Arousal/Alertness Awake/alert  Behavior During Therapy [facility] for tasks assessed/performed  Overall Cognitive Status Within Functional Limits for tasks assessed  Total Joint Exercises  Ankle Circles/Pumps AROM;Both;10 reps  Quad Sets AROM;Right;10 reps  Heel Slides AAROM;Right;10 reps  Hip ABduction/ADduction AROM;Right;10 reps  Straight Leg Raises AROM;Strengthening;Right;10 reps  Short Arc Quad AROM;10 reps;Strengthening;Right  Towel Squeeze AROM;Both;10 reps  Long Arc Quad AROM;AAROM;Strengthening;Right;10 reps  Knee Flexion AROM;AAROM;Right;5 reps;Seated  Goniometric ROM ~ 6 to 85 degrees flexion  PT - End of Session  Equipment Utilized During Treatment Gait belt  Activity Tolerance Patient tolerated treatment well  Patient left in chair;with call bell/phone within reach;with chair alarm set   PT - Assessment/Plan  PT Plan Current plan remains appropriate  PT Visit Diagnosis Difficulty in walking, not elsewhere classified (R26.2)  PT Frequency (ACUTE ONLY) 7X/week  Follow Up Recommendations Follow surgeon's recommendation for DC plan and follow-up therapies  PT equipment None recommended by PT  AM-PAC PT "6  Clicks" Mobility Outcome Measure (Version 2)  Help needed turning from your back to your side while in a flat bed without using bedrails? 3  Help needed moving from lying on your back to sitting on the side of a flat bed without using bedrails? 4  Help needed moving to and from a bed to a chair (including a wheelchair)? 3  Help needed standing up from a chair using your arms (e.g., wheelchair or bedside chair)? 3  Help needed to walk in hospital room? 3  Help needed climbing 3-5 steps with a railing?  3  6 Click Score 19  Consider Recommendation of Discharge To: Home with [facility]  PT Goal Progression  Progress towards PT goals Progressing toward goals  Acute Rehab PT Goals  PT Goal Formulation With patient  Time For Goal Achievement 03/17/19  Potential to Achieve Goals Good  PT Time Calculation  PT Start Time (ACUTE ONLY) 1405  PT Stop Time (ACUTE ONLY) 1435  PT Time Calculation (min) (ACUTE ONLY) 30 min  PT General Charges  $$ ACUTE PT VISIT 1 Visit  PT Treatments  $Therapeutic Exercise 23-37 mins

## 2019-03-11 NOTE — Progress Notes (Signed)
Physical Therapy Treatment Patient Details Name: Tiffany Perry MRN: [id number] DOB: 04/27/1951 Today's Date: 03/11/2019    History of Present Illness s/p R TKA    PT Comments    Progressing well; will be ready for d/c after second session this pm.   Follow Up Recommendations  Follow surgeon's recommendation for DC plan and follow-up therapies     Equipment Recommendations  None recommended by PT    Recommendations for Other Services       Precautions / Restrictions Precautions Precautions: Knee;Fall Precaution Comments: IND SLRs today Knee Immobilizer - Right: Discontinue once straight leg raise with < 10 degree lag Restrictions Weight Bearing Restrictions: No RLE Weight Bearing: Weight bearing as tolerated    Mobility  Bed Mobility Overal bed mobility: Needs Assistance Bed Mobility: Supine to Sit     Supine to sit: Modified independent (Device/Increase time)        Transfers Overall transfer level: Needs assistance Equipment used: Rolling walker (2 wheeled) Transfers: Sit to/from Stand Sit to Stand: Supervision         General transfer comment: cues for hand placement  Ambulation/Gait Ambulation/Gait assistance: Supervision;Min guard Gait Distance (Feet): 80 Feet Assistive device: Rolling walker (2 wheeled) Gait Pattern/deviations: Step-to pattern;Step-through pattern;Decreased weight shift to right     General Gait Details: cues for sequence and RW position   Stairs Stairs: Yes Stairs assistance: Min guard Stair Management: Step to pattern;Backwards;With walker Number of Stairs: 1 General stair comments: cues for sequence and technique   Wheelchair Mobility    Modified Rankin (Stroke Patients Only)       Balance                                            Cognition Arousal/Alertness: Awake/alert Behavior During Therapy: WFL for tasks assessed/performed Overall Cognitive Status: Within Functional Limits  for tasks assessed                                        Exercises      General Comments        Pertinent Vitals/Pain Pain Assessment: 0-10 Pain Score: 4  Pain Location: right knee Pain Descriptors / Indicators: Aching;Grimacing Pain Intervention(s): Limited activity within patient's tolerance;Monitored during session;Premedicated before session;Repositioned    Home Living                      Prior Function            PT Goals (current goals can now be found in the care plan section) Acute Rehab PT Goals PT Goal Formulation: With patient Time For Goal Achievement: 03/17/19 Potential to Achieve Goals: Good Progress towards PT goals: Progressing toward goals    Frequency    7X/week      PT Plan Current plan remains appropriate    Co-evaluation              AM-PAC PT "6 Clicks" Mobility   Outcome Measure  Help needed turning from your back to your side while in a flat bed without using bedrails?: A Little Help needed moving from lying on your back to sitting on the side of a flat bed without using bedrails?: None Help needed moving to and from a bed to a chair (including a  wheelchair)?: A Little Help needed standing up from a chair using your arms (e.g., wheelchair or bedside chair)?: A Little Help needed to walk in hospital room?: A Little Help needed climbing 3-5 steps with a railing? : A Little 6 Click Score: 19    End of Session Equipment Utilized During Treatment: Gait belt Activity Tolerance: Patient tolerated treatment well Patient left: in chair;with call bell/phone within reach;with chair alarm set   PT Visit Diagnosis: Difficulty in walking, not elsewhere classified (R26.2)     Time: 8003-4917 PT Time Calculation (min) (ACUTE ONLY): 11 min  Charges:  $Gait Training: 8-22 mins                     Kenyon Ana, PT  Pager: [phone] Acute Rehab Dept [facility]):  [phone]   03/11/2019    [facility] 03/11/2019, 12:12 PM

## 2019-03-11 NOTE — Progress Notes (Signed)
   Subjective: 1 Day Post-Op Procedure(s) (LRB): TOTAL KNEE ARTHROPLASTY (Right) Patient reports pain as mild.   Patient seen in rounds by Dr. Wynelle Link. Patient is well, and has had no acute complaints or problems other than soreness in the right knee. Denies chest pain, SOB, or calf pain. Foley catheter removed this AM. No issues overnight.  We will continue therapy today.   Objective: Vital signs in last 24 hours: Temp:  [96.3 F (35.7 C)-97.9 F (36.6 C)] 97.9 F (36.6 C) (08/11 0610) Pulse Rate:  [66-79] 70 (08/11 0610) Resp:  [10-19] 16 (08/11 0610) BP: (90-118)/(58-80) 115/71 (08/11 0610) SpO2:  [96 %-100 %] 99 % (08/11 0610)  Intake/Output from previous day:  Intake/Output Summary (Last 24 hours) at 03/11/2019 0719 Last data filed at 03/11/2019 0600 Gross per 24 hour  Intake 3535.81 ml  Output 3885 ml  Net -349.19 ml    Labs: Recent Labs    03/11/19 0254  HGB 11.8*   Recent Labs    03/11/19 0254  WBC 13.2*  RBC 3.79*  HCT 37.4  PLT 215   Recent Labs    03/11/19 0254  NA 140  K 4.5  CL 108  CO2 25  BUN 10  CREATININE 0.66  GLUCOSE 107*  CALCIUM 8.8*   Exam: General - Patient is Alert and Oriented Extremity - Neurologically intact Neurovascular intact Sensation intact distally Dorsiflexion/Plantar flexion intact Dressing - dressing C/D/I Motor Function - intact, moving foot and toes well on exam.   Past Medical History:  Diagnosis Date  . Depression   . GAD (generalized anxiety disorder)   . History of iron deficiency anemia   . Osteoarthritis   . Osteoporosis     Assessment/Plan: 1 Day Post-Op Procedure(s) (LRB): TOTAL KNEE ARTHROPLASTY (Right) Principal Problem:   OA (osteoarthritis) of knee Active Problems:   Osteoarthritis of right knee  Estimated body mass index is 21.87 kg/m as calculated from the following:   Height as of this encounter: 5\' 4"  (1.626 m).   Weight as of this encounter: 57.8 kg. Advance diet Up with therapy  D/C IV fluids  Anticipated LOS equal to or greater than 2 midnights due to - Age 3 and older with one or more of the following:  - Obesity  - Expected need for hospital services (PT, OT, Nursing) required for safe  discharge  - Anticipated need for postoperative skilled nursing care or inpatient rehab  - Active co-morbidities: None OR   - Unanticipated findings during/Post Surgery: None  - Patient is a high risk of re-admission due to: None    DVT Prophylaxis - Aspirin Weight bearing as tolerated. D/C O2 and pulse ox and try on room air. Hemovac pulled without difficulty, will continue therapy today.  Plan is to go Home after hospital stay. Plan for discharge today pending progress with therapy. Scheduled for outpatient physical therapy at [facility]. Follow-up in the office in 2 weeks.   Theresa Duty, PA-C Orthopedic Surgery 03/11/2019, 7:19 AM

## 2019-03-11 NOTE — Progress Notes (Signed)
Patient discharged to home w/ family. Given all belongings, instructions, equipment. Verbalized understanding of all instructions, all questions answered. Escorted to pov via w/c.

## 2019-03-12 NOTE — Discharge Summary (Signed)
Physician Discharge Summary   Patient ID: Tiffany Perry MRN: [id number] DOB/AGE: [birth year] 68 y.o.  Admit date: 03/10/2019 Discharge date: 03/11/2019  Primary Diagnosis: Osteoarthritis, right knee   Admission Diagnoses:  Past Medical History:  Diagnosis Date   Depression    GAD (generalized anxiety disorder)    History of iron deficiency anemia    Osteoarthritis    Osteoporosis    Discharge Diagnoses:   Principal Problem:   OA (osteoarthritis) of knee Active Problems:   Osteoarthritis of right knee  Estimated body mass index is 21.87 kg/m as calculated from the following:   Height as of this encounter: 5\' 4"  (1.626 m).   Weight as of this encounter: 57.8 kg.  Procedure:  Procedure(s) (LRB): TOTAL KNEE ARTHROPLASTY (Right)   Consults: None  HPI: Tiffany Perry is a 68 y.o. year old female with end stage OA of her right knee with progressively worsening pain and dysfunction. She has constant pain, with activity and at rest and significant functional deficits with difficulties even with ADLs. She has had extensive non-op management including analgesics, injections of cortisone and viscosupplements, and home exercise program, but remains in significant pain with significant dysfunction.Radiographs show bone on bone arthritis medial and patellofemoral. She presents now for right Total Knee Arthroplasty.    Laboratory Data: Admission on 03/10/2019, Discharged on 03/11/2019  Component Date Value Ref Range Status   WBC 03/11/2019 13.2* 4.0 - 10.5 K/uL Final   RBC 03/11/2019 3.79* 3.87 - 5.11 MIL/uL Final   Hemoglobin 03/11/2019 11.8* 12.0 - 15.0 g/dL Final   HCT 03/11/2019 37.4  36.0 - 46.0 % Final   MCV 03/11/2019 98.7  80.0 - 100.0 fL Final   MCH 03/11/2019 31.1  26.0 - 34.0 pg Final   MCHC 03/11/2019 31.6  30.0 - 36.0 g/dL Final   RDW 03/11/2019 11.9  11.5 - 15.5 % Final   Platelets 03/11/2019 215  150 - 400 K/uL Final   nRBC 03/11/2019  0.0  0.0 - 0.2 % Final   Performed at [facility], [place] [address] [place], [place] [address]   Sodium 03/11/2019 140  135 - 145 mmol/L Final   Potassium 03/11/2019 4.5  3.5 - 5.1 mmol/L Final   Chloride 03/11/2019 108  98 - 111 mmol/L Final   CO2 03/11/2019 25  22 - 32 mmol/L Final   Glucose, Bld 03/11/2019 107* 70 - 99 mg/dL Final   BUN 03/11/2019 10  8 - 23 mg/dL Final   Creatinine, Ser 03/11/2019 0.66  0.44 - 1.00 mg/dL Final   Calcium 03/11/2019 8.8* 8.9 - 10.3 mg/dL Final   GFR calc non Af Amer 03/11/2019 >60  >60 mL/min Final   GFR calc Af Amer 03/11/2019 >60  >60 mL/min Final   Anion gap 03/11/2019 7  5 - 15 Final   Performed at [facility], [place] [address] [place], [place] [address]  Hospital Outpatient Visit on 03/06/2019  Component Date Value Ref Range Status   SARS Coronavirus 2 03/06/2019 NEGATIVE  NEGATIVE Final   Comment: (NOTE) SARS-CoV-2 target nucleic acids are NOT DETECTED. The SARS-CoV-2 RNA is generally detectable in upper and lower respiratory specimens during the acute phase of infection. Negative results do not preclude SARS-CoV-2 infection, do not rule out co-infections with other pathogens, and should not be used as the sole basis for treatment or other patient management decisions. Negative results must be combined with clinical observations, patient history, and epidemiological information. The expected result is  Negative. Fact Sheet for Patients: SugarRoll.be Fact Sheet for Healthcare Providers: https://www.woods-mathews.com/ This test is not yet approved or cleared by the [place] FDA and  has been authorized for detection and/or diagnosis of SARS-CoV-2 by FDA under an Emergency Use Authorization (EUA). This EUA will remain  in effect (meaning this test can be used) for the duration of the COVID-19 declaration under Section 56                           4(b)(1) of the Act, 21 U.S.C. section 360bbb-3(b)(1), unless the authorization is terminated or revoked sooner. Performed at [facility], [place] [address], [place], [place] [address]   Hospital Outpatient Visit on 03/05/2019  Component Date Value Ref Range Status   aPTT 03/05/2019 32  24 - 36 seconds Final   Performed at [facility], [place] [address] [place], [place] [address]   WBC 03/05/2019 6.5  4.0 - 10.5 K/uL Final   RBC 03/05/2019 4.62  3.87 - 5.11 MIL/uL Final   Hemoglobin 03/05/2019 14.6  12.0 - 15.0 g/dL Final   HCT 03/05/2019 44.7  36.0 - 46.0 % Final   MCV 03/05/2019 96.8  80.0 - 100.0 fL Final   MCH 03/05/2019 31.6  26.0 - 34.0 pg Final   MCHC 03/05/2019 32.7  30.0 - 36.0 g/dL Final   RDW 03/05/2019 12.1  11.5 - 15.5 % Final   Platelets 03/05/2019 236  150 - 400 K/uL Final   nRBC 03/05/2019 0.0  0.0 - 0.2 % Final   Performed at [facility], [place] [address] [place], [place] [address]   Sodium 03/05/2019 142  135 - 145 mmol/L Final   Potassium 03/05/2019 3.9  3.5 - 5.1 mmol/L Final   Chloride 03/05/2019 107  98 - 111 mmol/L Final   CO2 03/05/2019 25  22 - 32 mmol/L Final   Glucose, Bld 03/05/2019 89  70 - 99 mg/dL Final   BUN 03/05/2019 13  8 - 23 mg/dL Final   Creatinine, Ser 03/05/2019 0.82  0.44 - 1.00 mg/dL Final   Calcium 03/05/2019 9.4  8.9 - 10.3 mg/dL Final   Total Protein 03/05/2019 6.9  6.5 - 8.1 g/dL Final   Albumin 03/05/2019 3.9  3.5 - 5.0 g/dL Final   AST 03/05/2019 19  15 - 41 U/L Final   ALT 03/05/2019 19  0 - 44 U/L Final   Alkaline Phosphatase 03/05/2019 80  38 - 126 U/L Final   Total Bilirubin 03/05/2019 0.6  0.3 - 1.2 mg/dL Final   GFR calc non Af Amer 03/05/2019 >60  >60 mL/min Final   GFR calc Af Amer 03/05/2019 >60  >60 mL/min Final   Anion gap 03/05/2019 10  5 - 15 Final   Performed at [facility], [place] [address], [place], [place] [address]    Prothrombin Time 03/05/2019 12.2  11.4 - 15.2 seconds Final   INR 03/05/2019 0.9  0.8 - 1.2 Final   Comment: (NOTE) INR goal varies based on device and disease states. Performed at [facility], [place] [address] [place], [place] [address]    ABO/RH(D) 03/05/2019 O POS   Final   Antibody Screen 03/05/2019 NEG   Final   Sample Expiration 03/05/2019 03/13/2019,2359   Final   Extend sample reason 03/05/2019    Final  Value:NO TRANSFUSIONS OR PREGNANCY IN THE PAST 3 MONTHS Performed at [place] [address], [place], [place] [address]    MRSA, PCR 03/05/2019 NEGATIVE  NEGATIVE Final   Staphylococcus aureus 03/05/2019 NEGATIVE  NEGATIVE Final   Comment: (NOTE) The Xpert SA Assay (FDA approved for NASAL specimens in patients 1 years of age and older), is one component of a comprehensive surveillance program. It is not intended to diagnose infection nor to guide or monitor treatment. Performed at [facility], [place] [address], [place], [place] [address]    ABO/RH(D) 03/05/2019    Final                   Value:O POS Performed at [facility], [place] [address], [place], [place] [address]      X-Rays:Dg Bone Density (dxa)  Result Date: 02/14/2019 EXAM: DUAL X-RAY ABSORPTIOMETRY (DXA) FOR BONE MINERAL DENSITY IMPRESSION: Referring Physician:  Sallee Lange PATIENT: Name: APPLECorleen, Otwell Patient ID: [id number] Birth Date: April 14, 1951 Height: 63.0 in. Sex: Female Measured: 02/14/2019 Weight: 128.2 lbs. Indications: Caucasian, Estrogen Deficient, Family History of Osteoporosis, History of Osteopenia, Postmenopausal, Wellbutrin Fractures: None Treatments: Calcium (E943.0), Vitamin D (E933.5) ASSESSMENT: The BMD measured at Femur Neck Left is 0.748 g/cm2 with a T-score of -2.1. This patient is considered osteopenic according to [facility]) criteria. There has been a  statistically significant increase in BMD of Lumbar spine, and no change in BMD of Left hip since prior exam dated 07/16/2014. The scan quality is good. Prior DXA exam performed on Hologic device measures only unilateral hip (not Total Mean Hip). Therefore, current Total Mean Hip cannot be compared to prior exam. Site Region Measured Date Measured Age YA BMD Significant CHANGE T-score AP Spine L1-L4 02/14/2019 66.6 -1.9 0.946 g/cm2 * AP Spine L1-L4 07/16/2014 62.0 -2.6 0.863 g/cm2 * DualFemur Neck Left  3/14/2019    66.6         -2.1    0.748 g/cm2 DualFemur Neck Left 07/16/2014 62.0 -2.1 0.748 g/cm2 * DualFemur Total Mean 3/14/2019    66.6         -1.6    0.806 g/cm2 World Health Organization [facility]) criteria for post-menopausal, Caucasian Women: Normal       T-score at or above -1 SD Osteopenia   T-score between -1 and -2.5 SD Osteoporosis T-score at or below -2.5 SD RECOMMENDATION: 1. All patients should optimize calcium and vitamin D intake. 2. Consider FDA approved medical therapies in postmenopausal women and men aged 25 years and older, based on the following: a. A hip or vertebral (clinical or morphometric) fracture b. T- score < or = -2.5 at the femoral neck or spine after appropriate evaluation to exclude secondary causes c. Low bone mass (T-score between -1.0 and -2.5 at the femoral neck or spine) and a 10 year probability of a hip fracture > or = 3% or a 10 year probability of a major osteoporosis-related fracture > or = 20% based on the [place]-adapted WHO algorithm d. Clinician judgment and/or patient preferences may indicate treatment for people with 10-year fracture probabilities above or below these levels FOLLOW-UP: People with diagnosed cases of osteoporosis or at high risk for fracture should have regular bone mineral density tests. For patients eligible for Medicare, routine testing is allowed once every 2 years. The testing frequency can be increased to one year for patients who have rapidly  progressing disease, those who are receiving or discontinuing medical therapy  to restore bone mass, or have additional risk factors. I have reviewed this report and agree with the above findings. [facility] FRAX* 10-year Probability of Fracture Based on femoral neck BMD: DualFemur (Left) Major Osteoporotic Fracture: 11.0% Hip Fracture:                2.0% Population:                  [place] (Caucasian) Risk Factors:                None *FRAX is a Materials engineer of the [employer] for Metabolic Bone Disease, a World Pharmacologist (WHO) [facility]. ASSESSMENT: The probability of a major osteoporotic fracture is 11.0 % within the next ten years. The probability of a hip fracture is 2.0 % within the next ten years. Electronically Signed   By: Claudie Revering M.D.   On: 02/14/2019 16:35    EKG:No orders found for this or any previous visit.   Hospital Course: Jerzey Komperda Conti is a 68 y.o. who was admitted to [facility]. They were brought to the operating room on 03/10/2019 and underwent Procedure(s): TOTAL KNEE ARTHROPLASTY.  Patient tolerated the procedure well and was later transferred to the recovery room and then to the orthopaedic floor for postoperative care. They were given PO and IV analgesics for pain control following their surgery. They were given 24 hours of postoperative antibiotics of  Anti-infectives (From admission, onward)   Start     Dose/Rate Route Frequency Ordered Stop   03/10/19 1600  ceFAZolin (ANCEF) IVPB 2g/100 mL premix     2 g 200 mL/hr over 30 Minutes Intravenous Every 6 hours 03/10/19 1206 03/10/19 2159   03/10/19 0700  ceFAZolin (ANCEF) IVPB 2g/100 mL premix     2 g 200 mL/hr over 30 Minutes Intravenous On call to O.R. 03/10/19 4696 03/10/19 0939     and started on DVT prophylaxis in the form of Aspirin.   PT and OT were ordered for total joint protocol. Discharge planning consulted to help with postop  disposition and equipment needs.  Patient had a good night on the evening of surgery. They started to get up OOB with therapy on POD #0. Pt was seen during rounds and was ready to go home pending progress with therapy. Hemovac drain was pulled without difficulty. She worked with therapy on POD #1 and was meeting her goals. Pt was discharged to home later that day in stable condition.  Diet: Regular diet Activity: WBAT Follow-up: in 2 weeks Disposition: Home with OPPT at [facility] Discharged Condition: stable   Discharge Instructions    Call MD / Call 911   Complete by: As directed    If you experience chest pain or shortness of breath, CALL 911 and be transported to the hospital emergency room.  If you develope a fever above 101 F, pus (white drainage) or increased drainage or redness at the wound, or calf pain, call your surgeon's office.   Change dressing   Complete by: As directed    Change dressing on Wednesday, then change the dressing daily with sterile 4 x 4 inch gauze dressing and apply TED hose.   Constipation Prevention   Complete by: As directed    Drink plenty of fluids.  Prune juice may be helpful.  You may use a stool softener, such as Colace (over the counter) 100 mg twice a day.  Use MiraLax (over the counter) for  constipation as needed.   Diet - low sodium heart healthy   Complete by: As directed    Discharge instructions   Complete by: As directed    Dr. Gaynelle Arabian Total Joint Specialist [facility] [address], [place], [place] [address] [phone]  TOTAL KNEE REPLACEMENT POSTOPERATIVE DIRECTIONS  Knee Rehabilitation, Guidelines Following Surgery  Results after knee surgery are often greatly improved when you follow the exercise, range of motion and muscle strengthening exercises prescribed by your doctor. Safety measures are also important to protect the knee from further injury. Any time any of these exercises cause you to have increased  pain or swelling in your knee joint, decrease the amount until you are comfortable again and slowly increase them. If you have problems or questions, call your caregiver or physical therapist for advice.   HOME CARE INSTRUCTIONS  Remove items at home which could result in a fall. This includes throw rugs or furniture in walking pathways.  ICE to the affected knee every three hours for 30 minutes at a time and then as needed for pain and swelling.  Continue to use ice on the knee for pain and swelling from surgery. You may notice swelling that will progress down to the foot and ankle.  This is normal after surgery.  Elevate the leg when you are not up walking on it.   Continue to use the breathing machine which will help keep your temperature down.  It is common for your temperature to cycle up and down following surgery, especially at night when you are not up moving around and exerting yourself.  The breathing machine keeps your lungs expanded and your temperature down. Do not place pillow under knee, focus on keeping the knee straight while resting   DIET You may resume your previous home diet once your are discharged from the hospital.  DRESSING / WOUND CARE / SHOWERING You may change your dressing 3-5 days after surgery.  Then change the dressing every day with sterile gauze.  Please use good hand washing techniques before changing the dressing.  Do not use any lotions or creams on the incision until instructed by your surgeon. You may start showering once you are discharged home but do not submerge the incision under water. Just pat the incision dry and apply a dry gauze dressing on daily. Change the surgical dressing daily and reapply a dry dressing each time.  ACTIVITY Walk with your walker as instructed. Use walker as long as suggested by your caregivers. Avoid periods of inactivity such as sitting longer than an hour when not asleep. This helps prevent blood clots.  You may resume a  sexual relationship in one month or when given the OK by your doctor.  You may return to work once you are cleared by your doctor.  Do not drive a car for 6 weeks or until released by you surgeon.  Do not drive while taking narcotics.  WEIGHT BEARING Weight bearing as tolerated with assist device (walker, cane, etc) as directed, use it as long as suggested by your surgeon or therapist, typically at least 4-6 weeks.  POSTOPERATIVE CONSTIPATION PROTOCOL Constipation - defined medically as fewer than three stools per week and severe constipation as less than one stool per week.  One of the most common issues patients have following surgery is constipation.  Even if you have a regular bowel pattern at home, your normal regimen is likely to be disrupted due to multiple reasons following surgery.  Combination of anesthesia, postoperative narcotics, change in appetite and fluid intake all can affect your bowels.  In order to avoid complications following surgery, here are some recommendations in order to help you during your recovery period.  Colace (docusate) - Pick up an over-the-counter form of Colace or another stool softener and take twice a day as long as you are requiring postoperative pain medications.  Take with a full glass of water daily.  If you experience loose stools or diarrhea, hold the colace until you stool forms back up.  If your symptoms do not get better within 1 week or if they get worse, check with your doctor.  Dulcolax (bisacodyl) - Pick up over-the-counter and take as directed by the product packaging as needed to assist with the movement of your bowels.  Take with a full glass of water.  Use this product as needed if not relieved by Colace only.   MiraLax (polyethylene glycol) - Pick up over-the-counter to have on hand.  MiraLax is a solution that will increase the amount of water in your bowels to assist with bowel movements.  Take as directed and can mix with a glass of water,  juice, soda, coffee, or tea.  Take if you go more than two days without a movement. Do not use MiraLax more than once per day. Call your doctor if you are still constipated or irregular after using this medication for 7 days in a row.  If you continue to have problems with postoperative constipation, please contact the office for further assistance and recommendations.  If you experience "the worst abdominal pain ever" or develop nausea or vomiting, please contact the office immediatly for further recommendations for treatment.  ITCHING  If you experience itching with your medications, try taking only a single pain pill, or even half a pain pill at a time.  You can also use Benadryl over the counter for itching or also to help with sleep.   TED HOSE STOCKINGS Wear the elastic stockings on both legs for three weeks following surgery during the day but you may remove then at night for sleeping.  MEDICATIONS See your medication summary on the "After Visit Summary" that the nursing staff will review with you prior to discharge.  You may have some home medications which will be placed on hold until you complete the course of blood thinner medication.  It is important for you to complete the blood thinner medication as prescribed by your surgeon.  Continue your approved medications as instructed at time of discharge.  PRECAUTIONS If you experience chest pain or shortness of breath - call 911 immediately for transfer to the hospital emergency department.  If you develop a fever greater that 101 F, purulent drainage from wound, increased redness or drainage from wound, foul odor from the wound/dressing, or calf pain - CONTACT YOUR SURGEON.                                                   FOLLOW-UP APPOINTMENTS Make sure you keep all of your appointments after your operation with your surgeon and caregivers. You should call the office at the above phone number and make an appointment for approximately two  weeks after the date of your surgery or on the date instructed by your surgeon outlined in the "After Visit Summary".   RANGE  OF MOTION AND STRENGTHENING EXERCISES  Rehabilitation of the knee is important following a knee injury or an operation. After just a few days of immobilization, the muscles of the thigh which control the knee become weakened and shrink (atrophy). Knee exercises are designed to build up the tone and strength of the thigh muscles and to improve knee motion. Often times heat used for twenty to thirty minutes before working out will loosen up your tissues and help with improving the range of motion but do not use heat for the first two weeks following surgery. These exercises can be done on a training (exercise) mat, on the floor, on a table or on a bed. Use what ever works the best and is most comfortable for you Knee exercises include:  Leg Lifts - While your knee is still immobilized in a splint or cast, you can do straight leg raises. Lift the leg to 60 degrees, hold for 3 sec, and slowly lower the leg. Repeat 10-20 times 2-3 times daily. Perform this exercise against resistance later as your knee gets better.  Quad and Hamstring Sets - Tighten up the muscle on the front of the thigh (Quad) and hold for 5-10 sec. Repeat this 10-20 times hourly. Hamstring sets are done by pushing the foot backward against an object and holding for 5-10 sec. Repeat as with quad sets.  Leg Slides: Lying on your back, slowly slide your foot toward your buttocks, bending your knee up off the floor (only go as far as is comfortable). Then slowly slide your foot back down until your leg is flat on the floor again. Angel Wings: Lying on your back spread your legs to the side as far apart as you can without causing discomfort.  A rehabilitation program following serious knee injuries can speed recovery and prevent re-injury in the future due to weakened muscles. Contact your doctor or a physical therapist for  more information on knee rehabilitation.   IF YOU ARE TRANSFERRED TO A SKILLED REHAB FACILITY If the patient is transferred to a skilled rehab facility following release from the hospital, a list of the current medications will be sent to the facility for the patient to continue.  When discharged from the skilled rehab facility, please have the facility set up the patient's [place] prior to being released. Also, the skilled facility will be responsible for providing the patient with their medications at time of release from the facility to include their pain medication, the muscle relaxants, and their blood thinner medication. If the patient is still at the rehab facility at time of the two week follow up appointment, the skilled rehab facility will also need to assist the patient in arranging follow up appointment in our office and any transportation needs.  MAKE SURE YOU:  Understand these instructions.  Get help right away if you are not doing well or get worse.    Pick up stool softner and laxative for home use following surgery while on pain medications. Do not submerge incision under water. Please use good hand washing techniques while changing dressing each day. May shower starting three days after surgery. Please use a clean towel to pat the incision dry following showers. Continue to use ice for pain and swelling after surgery. Do not use any lotions or creams on the incision until instructed by your surgeon.   Do not put a pillow under the knee. Place it under the heel.   Complete by: As directed  Driving restrictions   Complete by: As directed    No driving for two weeks   TED hose   Complete by: As directed    Use stockings (TED hose) for three weeks on both leg(s).  You may remove them at night for sleeping.   Weight bearing as tolerated   Complete by: As directed      Allergies as of 03/11/2019      Reactions   Codeine Nausea And Vomiting        Medication List    STOP taking these medications   etodolac 400 MG tablet Commonly known as: LODINE     TAKE these medications   ALPRAZolam 0.5 MG tablet Commonly known as: XANAX TAKE 1 TABLET BY MOUTH TWICE DAILY AS NEEDED FOR ANXIETY OR SLEEP. What changed: See the new instructions.   aspirin 325 MG EC tablet Take 1 tablet (325 mg total) by mouth 2 (two) times daily for 20 days. Then take one 81 mg aspirin once a day for three weeks. Then discontinue aspirin.   buPROPion 150 MG 12 hr tablet Commonly known as: WELLBUTRIN SR TAKE (1) TABLET BY MOUTH TWICE DAILY.   CALCIUM 500 PO Take 1 tablet by mouth 2 (two) times daily. Reported on 12/06/2015   gabapentin 300 MG capsule Commonly known as: NEURONTIN Take 1 capsule (300 mg total) by mouth 3 (three) times daily. Take a 300 mg capsule three times a day for two weeks following surgery.Then take a 300 mg capsule two times a day for two weeks. Then take a 300 mg capsule once a day for two weeks. Then discontinue.   methocarbamol 500 MG tablet Commonly known as: ROBAXIN Take 1 tablet (500 mg total) by mouth every 6 (six) hours as needed for muscle spasms.   oxyCODONE 5 MG immediate release tablet Commonly known as: Oxy IR/ROXICODONE Take 1-2 tablets (5-10 mg total) by mouth every 6 (six) hours as needed for severe pain.   traMADol 50 MG tablet Commonly known as: ULTRAM Take 1-2 tablets (50-100 mg total) by mouth every 6 (six) hours as needed for moderate pain.   VITAMIN B-12 PO Take 2,500 mcg by mouth daily.            Discharge Care Instructions  (From admission, onward)         Start     Ordered   03/11/19 0000  Weight bearing as tolerated     03/11/19 0722   03/11/19 0000  Change dressing    Comments: Change dressing on Wednesday, then change the dressing daily with sterile 4 x 4 inch gauze dressing and apply TED hose.   03/11/19 5597         Follow-up Information    Gaynelle Arabian, MD. Go on 03/25/2019.    Specialty: Orthopedic Surgery Why: You are scheduled for a post-operative appointment on 03-25-19 at 1:15 pm  Contact information: [address] [place] [address] [phone]        Rosilyn Mings.. Go on 03/13/2019.   Why: You are scheduled for a Physical Therapy appointment on 03-13-2019 at 10:00 am Contact information: [address] [place] [address] [phone]           Signed: Theresa Duty, PA-C Orthopedic Surgery 03/12/2019, 10:11 AM

## 2019-03-13 DIAGNOSIS — M25561 Pain in right knee: Secondary | ICD-10-CM | POA: Diagnosis not present

## 2019-03-17 DIAGNOSIS — M25561 Pain in right knee: Secondary | ICD-10-CM | POA: Diagnosis not present

## 2019-03-19 DIAGNOSIS — M25561 Pain in right knee: Secondary | ICD-10-CM | POA: Diagnosis not present

## 2019-03-21 DIAGNOSIS — M25561 Pain in right knee: Secondary | ICD-10-CM | POA: Diagnosis not present

## 2019-03-24 DIAGNOSIS — M25561 Pain in right knee: Secondary | ICD-10-CM | POA: Diagnosis not present

## 2019-03-26 DIAGNOSIS — M25561 Pain in right knee: Secondary | ICD-10-CM | POA: Diagnosis not present

## 2019-03-28 DIAGNOSIS — M25561 Pain in right knee: Secondary | ICD-10-CM | POA: Diagnosis not present

## 2019-03-31 DIAGNOSIS — M25561 Pain in right knee: Secondary | ICD-10-CM | POA: Diagnosis not present

## 2019-04-02 DIAGNOSIS — M25561 Pain in right knee: Secondary | ICD-10-CM | POA: Diagnosis not present

## 2019-04-04 DIAGNOSIS — M25561 Pain in right knee: Secondary | ICD-10-CM | POA: Diagnosis not present

## 2019-04-10 DIAGNOSIS — M25561 Pain in right knee: Secondary | ICD-10-CM | POA: Diagnosis not present

## 2019-04-11 DIAGNOSIS — M25561 Pain in right knee: Secondary | ICD-10-CM | POA: Diagnosis not present

## 2019-04-15 DIAGNOSIS — Z471 Aftercare following joint replacement surgery: Secondary | ICD-10-CM | POA: Diagnosis not present

## 2019-04-15 DIAGNOSIS — Z96651 Presence of right artificial knee joint: Secondary | ICD-10-CM | POA: Diagnosis not present

## 2019-04-15 DIAGNOSIS — Z5189 Encounter for other specified aftercare: Secondary | ICD-10-CM | POA: Insufficient documentation

## 2019-04-16 DIAGNOSIS — M25561 Pain in right knee: Secondary | ICD-10-CM | POA: Diagnosis not present

## 2019-04-22 DIAGNOSIS — M25561 Pain in right knee: Secondary | ICD-10-CM | POA: Diagnosis not present

## 2019-04-24 DIAGNOSIS — M25561 Pain in right knee: Secondary | ICD-10-CM | POA: Diagnosis not present

## 2019-05-01 DIAGNOSIS — M25561 Pain in right knee: Secondary | ICD-10-CM | POA: Diagnosis not present

## 2019-05-11 ENCOUNTER — Encounter: Payer: Self-pay | Admitting: Family Medicine

## 2019-05-19 DIAGNOSIS — Z96651 Presence of right artificial knee joint: Secondary | ICD-10-CM | POA: Insufficient documentation

## 2019-06-17 DIAGNOSIS — Z96651 Presence of right artificial knee joint: Secondary | ICD-10-CM | POA: Diagnosis not present

## 2019-06-17 DIAGNOSIS — Z471 Aftercare following joint replacement surgery: Secondary | ICD-10-CM | POA: Diagnosis not present

## 2019-08-04 ENCOUNTER — Other Ambulatory Visit: Payer: Self-pay | Admitting: Family Medicine

## 2019-08-04 NOTE — Telephone Encounter (Signed)
3 refills to do virtual follow-up by spring

## 2019-08-08 ENCOUNTER — Encounter: Payer: Self-pay | Admitting: Family Medicine

## 2019-08-08 ENCOUNTER — Ambulatory Visit (INDEPENDENT_AMBULATORY_CARE_PROVIDER_SITE_OTHER): Payer: Medicare Other | Admitting: Family Medicine

## 2019-08-08 DIAGNOSIS — Z20822 Contact with and (suspected) exposure to covid-19: Secondary | ICD-10-CM | POA: Diagnosis not present

## 2019-08-08 NOTE — Telephone Encounter (Signed)
Pt has been set up with phone visit with provider

## 2019-08-08 NOTE — Progress Notes (Signed)
   Subjective:    Patient ID: Tiffany Perry, female    DOB: June 16, 1951, 69 y.o.   MRN: [id number]  HPI Pt husband tested positive for COVID today. Pt husband had test done on Monday. Pt is wanting to know if she should be tested and when. Pt states that she is having some congestion but feel fine.   Virtual Visit via Telephone Note  I connected with Tiffany Perry on 08/08/19 at 12:30 PM EST by telephone and verified that I am speaking with the correct person using two identifiers.  Location: Patient: home Provider: office   I discussed the limitations, risks, security and privacy concerns of performing an evaluation and management service by telephone and the availability of in person appointments. I also discussed with the patient that there may be a patient responsible charge related to this service. The patient expressed understanding and agreed to proceed.   History of Present Illness:    Observations/Objective:   Assessment and Plan:   Follow Up Instructions:    I discussed the assessment and treatment plan with the patient. The patient was provided an opportunity to ask questions and all were answered. The patient agreed with the plan and demonstrated an understanding of the instructions.   The patient was advised to call back or seek an in-person evaluation if the symptoms worsen or if the condition fails to improve as anticipated.  I provided 18 minutes of non-face-to-face time during this encounter.  Patient has been closely exposed.  He has had rather classic symptoms.  He had a positive test.  This was just back through the weekend.  See notes above  Patient notes just a little bit of congestion at this time.  Really no other symptoms at this time.  No high fever no chills no shortness of breath.  Some scratchy throat.   Vicente Males, LPN  See above  Review of Systems     Objective:   Physical Exam  Virtual      Assessment & Plan:   Impression probable COVID-19 infection.  Discussed with patient.  Testing encouraged.  Limitations of test discussed.  Symptomatic care discussed.  Warning signs discussed.

## 2019-08-23 DIAGNOSIS — Z23 Encounter for immunization: Secondary | ICD-10-CM | POA: Diagnosis not present

## 2019-08-29 DIAGNOSIS — Z471 Aftercare following joint replacement surgery: Secondary | ICD-10-CM | POA: Diagnosis not present

## 2019-08-29 DIAGNOSIS — Z96651 Presence of right artificial knee joint: Secondary | ICD-10-CM | POA: Diagnosis not present

## 2019-09-22 DIAGNOSIS — Z23 Encounter for immunization: Secondary | ICD-10-CM | POA: Diagnosis not present

## 2019-10-22 DIAGNOSIS — H25013 Cortical age-related cataract, bilateral: Secondary | ICD-10-CM | POA: Diagnosis not present

## 2019-10-22 DIAGNOSIS — H04123 Dry eye syndrome of bilateral lacrimal glands: Secondary | ICD-10-CM | POA: Diagnosis not present

## 2019-10-22 DIAGNOSIS — H25813 Combined forms of age-related cataract, bilateral: Secondary | ICD-10-CM | POA: Diagnosis not present

## 2019-10-31 ENCOUNTER — Other Ambulatory Visit: Payer: Self-pay | Admitting: Family Medicine

## 2019-11-01 NOTE — Telephone Encounter (Signed)
May have 90 days but needs follow-up visit by June

## 2019-12-08 ENCOUNTER — Other Ambulatory Visit: Payer: Self-pay | Admitting: Family Medicine

## 2019-12-08 DIAGNOSIS — Z1231 Encounter for screening mammogram for malignant neoplasm of breast: Secondary | ICD-10-CM

## 2019-12-16 ENCOUNTER — Encounter (INDEPENDENT_AMBULATORY_CARE_PROVIDER_SITE_OTHER): Payer: Self-pay | Admitting: *Deleted

## 2019-12-18 ENCOUNTER — Other Ambulatory Visit: Payer: Self-pay

## 2019-12-18 ENCOUNTER — Ambulatory Visit
Admission: RE | Admit: 2019-12-18 | Discharge: 2019-12-18 | Disposition: A | Discharge status: Home / Self Care | Payer: Medicare Other | Source: Ambulatory Visit | Attending: Family Medicine | Admitting: Family Medicine

## 2019-12-18 DIAGNOSIS — Z1231 Encounter for screening mammogram for malignant neoplasm of breast: Secondary | ICD-10-CM

## 2020-02-05 ENCOUNTER — Encounter (INDEPENDENT_AMBULATORY_CARE_PROVIDER_SITE_OTHER): Payer: Self-pay | Admitting: *Deleted

## 2020-02-05 ENCOUNTER — Other Ambulatory Visit (INDEPENDENT_AMBULATORY_CARE_PROVIDER_SITE_OTHER): Payer: Self-pay | Admitting: *Deleted

## 2020-02-05 ENCOUNTER — Other Ambulatory Visit: Payer: Self-pay

## 2020-02-05 ENCOUNTER — Telehealth (INDEPENDENT_AMBULATORY_CARE_PROVIDER_SITE_OTHER): Payer: Self-pay | Admitting: *Deleted

## 2020-02-05 ENCOUNTER — Encounter (INDEPENDENT_AMBULATORY_CARE_PROVIDER_SITE_OTHER): Payer: Self-pay | Admitting: Gastroenterology

## 2020-02-05 ENCOUNTER — Ambulatory Visit (INDEPENDENT_AMBULATORY_CARE_PROVIDER_SITE_OTHER): Payer: Medicare Other | Admitting: Gastroenterology

## 2020-02-05 VITALS — BP 118/81 | HR 94 | Temp 99.1°F | Ht 63.0 in | Wt 128.3 lb

## 2020-02-05 DIAGNOSIS — Z86010 Personal history of colonic polyps: Secondary | ICD-10-CM

## 2020-02-05 DIAGNOSIS — R194 Change in bowel habit: Secondary | ICD-10-CM | POA: Diagnosis not present

## 2020-02-05 MED ORDER — SUPREP BOWEL PREP KIT 17.5-3.13-1.6 GM/177ML PO SOLN: 1.0000 | Freq: Once | ORAL | 0 refills | Status: AC

## 2020-02-05 NOTE — Telephone Encounter (Signed)
Patient needs suprep 

## 2020-02-05 NOTE — Progress Notes (Signed)
Patient profile: Tiffany Perry is a 69 y.o. female seen for evaluation of change in bowel habits.    History of Present Illness: Tiffany Perry is seen today and reports doing well from a GI standpoint. She had some issues with diarrhea when she was using NSAIDs in 2018 in 2019 and has not used since, diarrhea resolved with avoiding NSAIDs.  She currently is having a daily bowel movement without any abdominal pain.  Stools can vary on the [facility] stool scale from very hard to having some seepage after stools staining her underpants-this occurs intermittently.  She denies issues with urinary incontinence or large volume fecal incontinence.  She did have 4 children vaginally.  She denies any blood in her stool but has noted small amount of mucus.  She has no abdominal pain  She has no upper GI symptoms-denies GERD, nausea, vomiting, dysphagia.   She had TKR last August 2020, stays active swimming.  Wt Readings from Last 3 Encounters:  02/05/20 128 lb 4.8 oz (58.2 kg)  03/10/19 127 lb 7 oz (57.8 kg)  03/05/19 127 lb 7 oz (57.8 kg)     Last Colonoscopy: 2016-Findings:  Prep excellent. Scar noted at cecum at previous polypectomy site without residual or recurrent polyp. 5 mm polyp cold snared from ascending colon. Single small diverticulum noted at distal sigmoid colon. Linear scarring at distal rectum from previous episiotomy. Small hemorrhoids below the dentate line and single anal papilla. Both polyps TA-5 year repeat   Last Endoscopy:    Past Medical History:  Past Medical History:  Diagnosis Date   Depression    GAD (generalized anxiety disorder)    History of iron deficiency anemia    Osteoarthritis    Osteoporosis     Problem List: Patient Active Problem List   Diagnosis Date Noted   OA (osteoarthritis) of knee 03/10/2019   Osteoarthritis of right knee 03/10/2019   Osteopenia 08/10/2016   Chronic depression 11/11/2014   JOINT PAIN,  HAND 12/30/2008   TRIGGER FINGER 12/30/2008    Past Surgical History: Past Surgical History:  Procedure Laterality Date   COLONOSCOPY  11/16/2011   Procedure: COLONOSCOPY;  Surgeon: Rogene Houston, MD;  Location: [facility];  Service: Endoscopy;  Laterality: N/A;  730   COLONOSCOPY N/A 01/13/2015   Procedure: COLONOSCOPY;  Surgeon: Rogene Houston, MD;  Location: [facility];  Service: Endoscopy;  Laterality: N/A;  [place] Left    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   Procedure: TOTAL KNEE ARTHROPLASTY;  Surgeon: Gaynelle Arabian, MD;  Location: [facility];  Service: Orthopedics;  Laterality: Right;  38mn    Allergies: Allergies  Allergen Reactions   Codeine Nausea And Vomiting      Home Medications:  Current Outpatient Medications:    ALPRAZolam (XANAX) 0.5 MG tablet, TAKE 1 TABLET BY MOUTH TWICE DAILY AS NEEDED FOR ANXIETY OR SLEEP. (Patient taking differently: Take by mouth 2 (two) times daily as needed for anxiety. ), Disp: 30 tablet, Rfl: 2   buPROPion (WELLBUTRIN SR) 150 MG 12 hr tablet, TAKE (1) TABLET BY MOUTH TWICE DAILY., Disp: 60 tablet, Rfl: 2   Calcium-Magnesium-Vitamin D (CALCIUM 500 PO), Take 1 tablet by mouth 2 (two) times daily. Reported on 12/06/2015, Disp: , Rfl:    SUPREP BOWEL PREP KIT 17.5-3.13-1.6 GM/177ML SOLN, Take 1 kit by mouth once for 1 dose., Disp: 177 mL, Rfl: 0   Family  History: family history includes Colon cancer (age of onset: 76) in her cousin; Heart attack in her father; Hypertension in her father.  Paternal cousin with colon cancer  Social History:   reports that she has never smoked. She has never used smokeless tobacco. She reports that she does not drink alcohol and does not use drugs.   Review of Systems: Constitutional: Denies weight loss/weight gain  Eyes: No changes in vision. ENT: No oral lesions, sore throat.  GI: see HPI.  Heme/Lymph: No easy bruising.  CV: No chest  pain.  GU: No hematuria.  Integumentary: No rashes.  Neuro: No headaches.  Psych: No depression/anxiety.  Endocrine: No heat/cold intolerance.  Allergic/Immunologic: No urticaria.  Resp: No cough, SOB.  Musculoskeletal: No joint swelling.    Physical Examination: BP 118/81 (BP Location: Right Arm, Patient Position: Sitting, Cuff Size: Normal)    Pulse 94    Temp 99.1 F (37.3 C) (Oral)    Ht 5' 3"  (1.6 m)    Wt 128 lb 4.8 oz (58.2 kg)    BMI 22.73 kg/m  Gen: NAD, alert and oriented x 4 HEENT: PEERLA, EOMI, Neck: supple, no JVD Chest: CTA bilaterally, no wheezes, crackles, or other adventitious sounds CV: RRR, no m/g/c/r Abd: soft, NT, ND, +BS in all four quadrants; no HSM, guarding, ridigity, or rebound tenderness Ext: no edema, well perfused with 2+ pulses, Skin: no rash or lesions noted on observed skin Lymph: no noted LAD  Data:   Assessment/Plan: Ms. Askren is a 69 y.o. female  Kiran was seen today for new patient (initial visit).  Diagnoses and all orders for this visit:  Personal history of colonic polyps  Change in bowel habits   1.  Personal history of colon polyps-last colonoscopy was over 5 years ago.  We will schedule at this time.  She denies prior issues with sedation.  She only uses Xanax approximately 3 times a year.    Patient denies CP, SOB, and use of blood thinners. I discussed the risks and benefits of procedure including bleeding, perforation, infection, missed lesions, medication reactions and possible hospitalization or surgery if complications. All questions answered.   2.  Change in bowel habits-describe small amount of seepage after stooling.  We discussed trying a fiber supplement to see if would bulk stools.  Can assess further at time of colonoscopy.     I personally performed the service, non-incident to. (WP)  Laurine Blazer, [facility]

## 2020-02-05 NOTE — Patient Instructions (Signed)
We are scheduling colonoscopy for evaluation - try benefiber or citrucel in interim.

## 2020-02-10 ENCOUNTER — Other Ambulatory Visit: Payer: Self-pay

## 2020-02-10 ENCOUNTER — Ambulatory Visit (INDEPENDENT_AMBULATORY_CARE_PROVIDER_SITE_OTHER): Payer: Medicare Other | Admitting: Family Medicine

## 2020-02-10 VITALS — BP 120/76 | Temp 97.2°F | Ht 63.0 in | Wt 128.2 lb

## 2020-02-10 DIAGNOSIS — E785 Hyperlipidemia, unspecified: Secondary | ICD-10-CM | POA: Diagnosis not present

## 2020-02-10 DIAGNOSIS — Z23 Encounter for immunization: Secondary | ICD-10-CM

## 2020-02-10 DIAGNOSIS — F329 Major depressive disorder, single episode, unspecified: Secondary | ICD-10-CM | POA: Diagnosis not present

## 2020-02-10 DIAGNOSIS — F32A Depression, unspecified: Secondary | ICD-10-CM

## 2020-02-10 DIAGNOSIS — M2550 Pain in unspecified joint: Secondary | ICD-10-CM | POA: Diagnosis not present

## 2020-02-10 DIAGNOSIS — H60503 Unspecified acute noninfective otitis externa, bilateral: Secondary | ICD-10-CM

## 2020-02-10 DIAGNOSIS — F419 Anxiety disorder, unspecified: Secondary | ICD-10-CM | POA: Diagnosis not present

## 2020-02-10 MED ORDER — BUPROPION HCL ER (SR) 150 MG PO TB12: ORAL_TABLET | ORAL | 6 refills | Status: DC

## 2020-02-10 MED ORDER — CIPROFLOXACIN-DEXAMETHASONE 0.3-0.1 % OT SUSP
4.0000 [drp] | Freq: Two times a day (BID) | OTIC | 2 refills | Status: DC
Start: 2020-02-10 — End: 2020-03-11
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
## 2020-02-12 DIAGNOSIS — M2550 Pain in unspecified joint: Secondary | ICD-10-CM | POA: Diagnosis not present

## 2020-02-12 DIAGNOSIS — E785 Hyperlipidemia, unspecified: Secondary | ICD-10-CM | POA: Diagnosis not present

## 2020-02-13 ENCOUNTER — Encounter: Payer: Self-pay | Admitting: Family Medicine

## 2020-02-13 LAB — BASIC METABOLIC PANEL
BUN/Creatinine Ratio: 12 (ref 12–28)
BUN: 10 mg/dL (ref 8–27)
CO2: 25 mmol/L (ref 20–29)
Calcium: 9.4 mg/dL (ref 8.7–10.3)
Chloride: 105 mmol/L (ref 96–106)
Creatinine, Ser: 0.85 mg/dL (ref 0.57–1.00)
GFR calc Af Amer: 82 mL/min/{1.73_m2} (ref >59)
GFR calc non Af Amer: 71 mL/min/{1.73_m2} (ref >59)
Glucose: 87 mg/dL (ref 65–99)
Potassium: 4.4 mmol/L (ref 3.5–5.2)
Sodium: 142 mmol/L (ref 134–144)

## 2020-02-13 LAB — LIPID PANEL
Chol/HDL Ratio: 2.1 ratio (ref 0.0–4.4)
Cholesterol, Total: 197 mg/dL (ref 100–199)
HDL: 95 mg/dL (ref >39)
LDL Chol Calc (NIH): 92 mg/dL (ref 0–99)
Triglycerides: 50 mg/dL (ref 0–149)
VLDL Cholesterol Cal: 10 mg/dL (ref 5–40)

## 2020-02-17 DIAGNOSIS — Z01419 Encounter for gynecological examination (general) (routine) without abnormal findings: Secondary | ICD-10-CM | POA: Diagnosis not present

## 2020-02-17 DIAGNOSIS — Z8659 Personal history of other mental and behavioral disorders: Secondary | ICD-10-CM | POA: Diagnosis not present

## 2020-03-09 ENCOUNTER — Other Ambulatory Visit (HOSPITAL_COMMUNITY)
Admission: RE | Admit: 2020-03-09 | Discharge: 2020-03-09 | Disposition: A | Discharge status: Home / Self Care | Payer: Medicare Other | Source: Ambulatory Visit | Attending: Internal Medicine | Admitting: Internal Medicine

## 2020-03-09 ENCOUNTER — Other Ambulatory Visit: Payer: Self-pay

## 2020-03-09 DIAGNOSIS — Z01812 Encounter for preprocedural laboratory examination: Secondary | ICD-10-CM | POA: Insufficient documentation

## 2020-03-09 DIAGNOSIS — Z20822 Contact with and (suspected) exposure to covid-19: Secondary | ICD-10-CM | POA: Insufficient documentation

## 2020-03-10 LAB — SARS CORONAVIRUS 2 (TAT 6-24 HRS): SARS Coronavirus 2: NEGATIVE

## 2020-03-11 ENCOUNTER — Other Ambulatory Visit: Payer: Self-pay

## 2020-03-11 ENCOUNTER — Ambulatory Visit (HOSPITAL_COMMUNITY)
Admission: RE | Admit: 2020-03-11 | Discharge: 2020-03-11 | Disposition: A | Discharge status: Home / Self Care | Payer: Medicare Other | Attending: Internal Medicine | Admitting: Internal Medicine

## 2020-03-11 ENCOUNTER — Encounter (HOSPITAL_COMMUNITY)
Admission: RE | Disposition: A | Discharge status: Home / Self Care | Payer: Self-pay | Source: Home / Self Care | Attending: Internal Medicine

## 2020-03-11 ENCOUNTER — Encounter (HOSPITAL_COMMUNITY): Payer: Self-pay | Admitting: Internal Medicine

## 2020-03-11 DIAGNOSIS — Z885 Allergy status to narcotic agent status: Secondary | ICD-10-CM | POA: Diagnosis not present

## 2020-03-11 DIAGNOSIS — Z86010 Personal history of colonic polyps: Secondary | ICD-10-CM | POA: Insufficient documentation

## 2020-03-11 DIAGNOSIS — Z1211 Encounter for screening for malignant neoplasm of colon: Secondary | ICD-10-CM | POA: Insufficient documentation

## 2020-03-11 DIAGNOSIS — D120 Benign neoplasm of cecum: Secondary | ICD-10-CM | POA: Insufficient documentation

## 2020-03-11 DIAGNOSIS — F329 Major depressive disorder, single episode, unspecified: Secondary | ICD-10-CM | POA: Insufficient documentation

## 2020-03-11 DIAGNOSIS — K5730 Diverticulosis of large intestine without perforation or abscess without bleeding: Secondary | ICD-10-CM | POA: Insufficient documentation

## 2020-03-11 DIAGNOSIS — F411 Generalized anxiety disorder: Secondary | ICD-10-CM | POA: Diagnosis not present

## 2020-03-11 DIAGNOSIS — Z79899 Other long term (current) drug therapy: Secondary | ICD-10-CM | POA: Insufficient documentation

## 2020-03-11 DIAGNOSIS — M1990 Unspecified osteoarthritis, unspecified site: Secondary | ICD-10-CM | POA: Insufficient documentation

## 2020-03-11 DIAGNOSIS — D122 Benign neoplasm of ascending colon: Secondary | ICD-10-CM | POA: Insufficient documentation

## 2020-03-11 DIAGNOSIS — Z09 Encounter for follow-up examination after completed treatment for conditions other than malignant neoplasm: Secondary | ICD-10-CM | POA: Diagnosis not present

## 2020-03-11 HISTORY — PX: POLYPECTOMY: SHX5525

## 2020-03-11 HISTORY — PX: COLONOSCOPY: SHX5424

## 2020-03-11 HISTORY — PX: BIOPSY: SHX5522

## 2020-03-11 SURGERY — COLONOSCOPY
Anesthesia: Moderate Sedation

## 2020-03-11 MED ORDER — SODIUM CHLORIDE 0.9 % IV SOLN: INTRAVENOUS | Status: DC

## 2020-03-11 MED ORDER — MIDAZOLAM HCL 5 MG/5ML IJ SOLN
INTRAMUSCULAR | Status: AC
  Filled 2020-03-11: qty 10

## 2020-03-11 MED ORDER — MIDAZOLAM HCL 5 MG/5ML IJ SOLN
INTRAMUSCULAR | Status: DC | PRN
  Administered 2020-03-11: 1 mg via INTRAVENOUS
  Administered 2020-03-11 (×2): 2 mg via INTRAVENOUS

## 2020-03-11 MED ORDER — MEPERIDINE HCL 50 MG/ML IJ SOLN
INTRAMUSCULAR | Status: AC
  Filled 2020-03-11: qty 1

## 2020-03-11 MED ORDER — LOPERAMIDE HCL 2 MG PO TABS
1.0000 mg | ORAL_TABLET | Freq: Every day | ORAL | 0 refills | Status: DC
Start: 2020-03-11 — End: 2020-04-30

## 2020-03-11 MED ORDER — MEPERIDINE HCL 50 MG/ML IJ SOLN
INTRAMUSCULAR | Status: DC | PRN
  Administered 2020-03-11 (×2): 25 mg

## 2020-03-11 NOTE — Op Note (Signed)
[facility] Patient Name: Tiffany Perry Procedure Date: 03/11/2020 1:01 PM MRN: [id number] Date of Birth: 09/17/1950 Attending MD: Hildred Laser , MD CSN: [id number] Age: 69 Admit Type: Outpatient Procedure:                Colonoscopy Indications:              High risk colon cancer surveillance: Personal                            history of colonic polyps Providers:                Hildred Laser, MD, Janeece Riggers, RN, Lambert Mody, Randa Spike, Technician Referring MD:             Elayne Snare Wolfgang Phoenix, MD Medicines:                Meperidine 50 mg IV, Midazolam 5 mg IV Complications:            No immediate complications. Estimated Blood Loss:     Estimated blood loss was minimal. Procedure:                Pre-Anesthesia Assessment:                           - Prior to the procedure, a History and Physical                            was performed, and patient medications and                            allergies were reviewed. The patient's tolerance of                            previous anesthesia was also reviewed. The risks                            and benefits of the procedure and the sedation                            options and risks were discussed with the patient.                            All questions were answered, and informed consent                            was obtained. Prior Anticoagulants: The patient has                            taken no previous anticoagulant or antiplatelet                            agents. ASA Grade Assessment: II - A patient with  mild systemic disease. After reviewing the risks                            and benefits, the patient was deemed in                            satisfactory condition to undergo the procedure.                           After obtaining informed consent, the colonoscope                            was passed under direct vision. Throughout the                             procedure, the patient's blood pressure, pulse, and                            oxygen saturations were monitored continuously. The                            [id number]-[id number] ([id number]) scope was introduced through                            the anus and advanced to the the cecum, identified                            by appendiceal orifice and ileocecal valve. The                            colonoscopy was technically difficult and complex                            due to restricted mobility of the colon and a                            tortuous colon. Successful completion of the                            procedure was aided by withdrawing the scope and                            replacing with the 'babyscope'. Scope In: 1:24:00 PM Scope Out: 1:55:05 PM Scope Withdrawal Time: 0 hours 12 minutes 31 seconds  Total Procedure Duration: 0 hours 31 minutes 5 seconds  Findings:      The perianal and digital rectal examinations were normal.      A 4 mm polyp was found in the cecum. The polyp was removed with a cold       snare. Resection and retrieval were complete. The pathology specimen was       placed into Bottle Number 1.      A small polyp was found in the proximal ascending colon. The polyp was       sessile. Biopsies were taken with a cold  forceps for histology. The       pathology specimen was placed into Bottle Number 1.      A few diverticula were found in the sigmoid colon. Biopsies for       histology were taken with a cold forceps from the right colon and       sigmoid colon for evaluation of microscopic colitis. The pathology       specimen was placed into Bottle Number 2.      The exam was otherwise normal throughout the examined colon.      The retroflexed view of the distal rectum and anal verge was normal and       showed no anal or rectal abnormalities. Impression:               - One 4 mm polyp in the cecum, removed with a cold                             snare. Resected and retrieved.                           - One small polyp in the proximal ascending colon.                            Biopsied.                           - Diverticulosis in the sigmoid colon. Biopsied. Moderate Sedation:      Moderate (conscious) sedation was administered by the endoscopy nurse       and supervised by the endoscopist. The following parameters were       monitored: oxygen saturation, heart rate, blood pressure, CO2       capnography and response to care. Total physician intraservice time was       36 minutes. Recommendation:           - Patient has a contact number available for                            emergencies. The signs and symptoms of potential                            delayed complications were discussed with the                            patient. Return to normal activities tomorrow.                            Written discharge instructions were provided to the                            patient.                           - Resume previous diet today.                           - Continue present medications.                           -  No aspirin, ibuprofen, naproxen, or other                            non-steroidal anti-inflammatory drugs for 1 day.                           - Await pathology results.                           - Repeat colonoscopy in 5 years for surveillance. Procedure Code(s):        --- Professional ---                           [phone], Colonoscopy, flexible; with removal of                            tumor(s), polyp(s), or other lesion(s) by snare                            technique                           45380, 59, Colonoscopy, flexible; with biopsy,                            single or multiple                           99153, Moderate sedation; each additional 15                            minutes intraservice time                           G0500, Moderate sedation services provided by the                             same physician or other qualified health care                            professional performing a gastrointestinal                            endoscopic service that sedation supports,                            requiring the presence of an independent trained                            observer to assist in the monitoring of the                            patient's level of consciousness and physiological                            status; initial 15 minutes of intra-service time;  patient age 81 years or older (additional time may                            be reported with [phone], as appropriate) Diagnosis Code(s):        --- Professional ---                           Z86.010, Personal history of colonic polyps                           K63.5, Polyp of colon                           K57.30, Diverticulosis of large intestine without                            perforation or abscess without bleeding CPT copyright 2019 American Medical Association. All rights reserved. The codes documented in this report are preliminary and upon coder review may  be revised to meet current compliance requirements. Hildred Laser, MD Hildred Laser, MD 03/11/2020 2:05:24 PM This report has been signed electronically. Number of Addenda: 0

## 2020-03-11 NOTE — Discharge Instructions (Signed)
No aspirin or NSAIDs for 24 hours Resume usual medications and diet as before. Imodium OTC 1 mg by mouth daily with breakfast. No driving for 24 hours Physician will call with biopsy results.   Colonoscopy, Adult, Care After This sheet gives you information about how to care for yourself after your procedure. Your health care provider may also give you more specific instructions. If you have problems or questions, contact your health care provider.  Dr. Laural Golden:  [phone]  What can I expect after the procedure? After the procedure, it is common to have:  A small amount of blood in your stool for 24 hours after the procedure.  Some gas.  Mild cramping or bloating of your abdomen. Follow these instructions at home: Eating and drinking   Drink enough fluid to keep your urine pale yellow.  Resume your normal diet  Activity  Rest as told by your health care provider.  Avoid sitting for a long time without moving. Get up to take short walks every 1-2 hours. This is important to improve blood flow and breathing. Ask for help if you feel weak or unsteady.  Return to your normal activities as told by your health care provider. Ask your health care provider what activities are safe for you. Managing cramping and bloating   Try walking around when you have cramps or feel bloated.  General instructions  For the first 24 hours after the procedure: ? Do not drive or use machinery. ? Do not sign important documents. ? Do not drink alcohol.  Take over-the-counter and prescription medicines only as told by your health care provider.  Keep all follow-up visits as told by your health care provider. This is important. Contact a health care provider if:  You have blood in your stool 2-3 days after the procedure. Get help right away if you have:  More than a small spotting of blood in your stool.  Large blood clots in your stool.  Swelling of your abdomen.  Nausea or  vomiting.  A fever.  Increasing pain in your abdomen that is not relieved with medicine. Summary  After the procedure, it is common to have a small amount of blood in your stool. You may also have mild cramping and bloating of your abdomen.  For the first 24 hours after the procedure, do not drive or use machinery, sign important documents, or drink alcohol.  Get help right away if you have a lot of blood in your stool, nausea or vomiting, a fever, or increased pain in your abdomen. This information is not intended to replace advice given to you by your health care provider. Make sure you discuss any questions you have with your health care provider. Document Revised: 02/10/2019 Document Reviewed: 02/10/2019 Elsevier Patient Education  [place].

## 2020-03-11 NOTE — H&P (Signed)
Tiffany Perry is an 69 y.o. female.   Chief Complaint: Patient is here for colonoscopy. HPI: Patient is 69 year old Caucasian female who has history of colonic polyps.  She had 5 polyps removed back in April 2013 including a 17 mm adenoma from cecum.  She had one polyp removed on her last colonoscopy of June 2016.  She is returning for surveillance colonoscopy.  She denies rectal bleeding.  She has noted change in her bowel habits.  No states she has 4-5 stools per day.  She has some seepage.  Stools are usually semiformed or loose.  She denies abdominal pain or cramping.  She says she used to be on Aleve which she took for 18 months and change in her bowel habits may have started then.  However she has not taken this medicine for close to 4 years. Family history is negative for CRC.  Past Medical History:  Diagnosis Date  . Depression   . GAD (generalized anxiety disorder)   . History of iron deficiency anemia   . Osteoarthritis   . Osteoporosis     Past Surgical History:  Procedure Laterality Date  . COLONOSCOPY  11/16/2011   Procedure: COLONOSCOPY;  Surgeon: Rogene Houston, MD;  Location: [facility];  Service: Endoscopy;  Laterality: N/A;  730  . COLONOSCOPY N/A 01/13/2015   Procedure: COLONOSCOPY;  Surgeon: Rogene Houston, MD;  Location: [facility];  Service: Endoscopy;  Laterality: N/A;  830  . DILATION AND CURETTAGE OF UTERUS    . FOOT SURGERY Left   . 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   Procedure: TOTAL KNEE ARTHROPLASTY;  Surgeon: Gaynelle Arabian, MD;  Location: [facility];  Service: Orthopedics;  Laterality: Right;  38min    Family History  Problem Relation Age of Onset  . Colon cancer Cousin 65  . Hypertension Father   . Heart attack Father   . Breast cancer Neg Hx    Social History:  reports that she has never smoked. She has never used smokeless tobacco. She reports that she does not drink alcohol and does not use drugs.  Allergies:  Allergies   Allergen Reactions  . Codeine Nausea And Vomiting    Medications Prior to Admission  Medication Sig Dispense Refill  . buPROPion (WELLBUTRIN SR) 150 MG 12 hr tablet 1 bid (Patient taking differently: Take 150 mg by mouth 2 (two) times daily. 1 bid) 60 tablet 6  . Calcium-Magnesium-Vitamin D (CALCIUM 500 PO) Take 1 tablet by mouth 2 (two) times daily. Reported on 12/06/2015    . ALPRAZolam (XANAX) 0.5 MG tablet TAKE 1 TABLET BY MOUTH TWICE DAILY AS NEEDED FOR ANXIETY OR SLEEP. (Patient taking differently: Take 0.5 mg by mouth 2 (two) times daily as needed for anxiety. ) 30 tablet 2  . ciprofloxacin-dexamethasone (CIPRODEX) OTIC suspension Place 4 drops into both ears 2 (two) times daily. (Patient not taking: Reported on 02/26/2020) 7.5 mL 2    No results found for this or any previous visit (from the past 48 hour(s)). No results found.  Review of Systems  Blood pressure 128/81, pulse 71, temperature 98.1 F (36.7 C), temperature source Oral, resp. rate 18, height 5\' 3"  (1.6 m), weight 58.5 kg, SpO2 100 %. Physical Exam HENT:     Mouth/Throat:     Mouth: Mucous membranes are moist.     Pharynx: Oropharynx is clear.  Eyes:     General: No scleral icterus.    Conjunctiva/sclera: Conjunctivae normal.  Cardiovascular:  Rate and Rhythm: Normal rate and regular rhythm.     Heart sounds: Normal heart sounds. No murmur heard.   Pulmonary:     Effort: Pulmonary effort is normal.     Breath sounds: Normal breath sounds.  Abdominal:     General: Abdomen is flat. There is no distension.     Palpations: There is no mass.     Tenderness: There is no abdominal tenderness.  Musculoskeletal:        General: No swelling.     Cervical back: Neck supple.  Lymphadenopathy:     Cervical: No cervical adenopathy.  Skin:    General: Skin is warm and dry.  Neurological:     Mental Status: She is alert.      Assessment/Plan History of colonic polyps. Change in bowel habits. Surveillance  colonoscopy.  Hildred Laser, MD 03/11/2020, 1:15 PM

## 2020-03-12 DIAGNOSIS — Z6822 Body mass index (BMI) 22.0-22.9, adult: Secondary | ICD-10-CM | POA: Diagnosis not present

## 2020-03-12 DIAGNOSIS — M25512 Pain in left shoulder: Secondary | ICD-10-CM | POA: Diagnosis not present

## 2020-03-12 DIAGNOSIS — M2550 Pain in unspecified joint: Secondary | ICD-10-CM | POA: Diagnosis not present

## 2020-03-12 DIAGNOSIS — M25511 Pain in right shoulder: Secondary | ICD-10-CM | POA: Diagnosis not present

## 2020-03-12 LAB — SURGICAL PATHOLOGY

## 2020-03-16 ENCOUNTER — Encounter (HOSPITAL_COMMUNITY): Payer: Self-pay | Admitting: Internal Medicine

## 2020-03-22 ENCOUNTER — Encounter: Payer: Self-pay | Admitting: Family Medicine

## 2020-03-23 ENCOUNTER — Other Ambulatory Visit: Payer: Self-pay | Admitting: *Deleted

## 2020-03-23 DIAGNOSIS — M25519 Pain in unspecified shoulder: Secondary | ICD-10-CM

## 2020-03-23 NOTE — Telephone Encounter (Signed)
Nurses Please put in referral for [facility] They have multiple doctors who are excellent for the shoulder Dr. Mancel Bale, Dr. Onnie Graham, Dr. Theda Sers  Please send patient my chart regarding this process Hopefully they will be able to get her in within the coming weeks

## 2020-04-01 DIAGNOSIS — M25511 Pain in right shoulder: Secondary | ICD-10-CM | POA: Diagnosis not present

## 2020-04-01 DIAGNOSIS — M25512 Pain in left shoulder: Secondary | ICD-10-CM | POA: Diagnosis not present

## 2020-04-09 DIAGNOSIS — Z471 Aftercare following joint replacement surgery: Secondary | ICD-10-CM | POA: Diagnosis not present

## 2020-04-09 DIAGNOSIS — Z96651 Presence of right artificial knee joint: Secondary | ICD-10-CM | POA: Diagnosis not present

## 2020-04-30 ENCOUNTER — Encounter: Payer: Self-pay | Admitting: Family Medicine

## 2020-04-30 ENCOUNTER — Other Ambulatory Visit: Payer: Self-pay

## 2020-04-30 ENCOUNTER — Ambulatory Visit (INDEPENDENT_AMBULATORY_CARE_PROVIDER_SITE_OTHER): Payer: Medicare Other | Admitting: Family Medicine

## 2020-04-30 VITALS — HR 86 | Temp 98.7°F | Resp 16

## 2020-04-30 DIAGNOSIS — J0110 Acute frontal sinusitis, unspecified: Secondary | ICD-10-CM | POA: Diagnosis not present

## 2020-04-30 MED ORDER — AMOXICILLIN 500 MG PO CAPS: 500.0000 mg | ORAL_CAPSULE | Freq: Three times a day (TID) | ORAL | 0 refills | Status: DC

## 2020-04-30 NOTE — Progress Notes (Signed)
Patient ID: Tiffany Perry, female    DOB: 1951/03/08, 69 y.o.   MRN: [id number]   Chief Complaint  Patient presents with  . Sinusitis   Subjective:    HPI   CC-runny nose, sore throat at night, inside of nose is itching.  Having yellow discharge.  Dec energy and fatigue.  having facial pain. Started 6 days ago.  Yolanda Bonine is born today and suppose to fly to [facility] next Wednesday.  Had covid test done on Monday and got results next day that pt states was negative.  No fever. meds- none Sick contacts- no sick contacts or known covid contacts.  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  Pt flying out to [place] in 5 days, and having another covid test 2 days before the flight.  Medical History Kareli has a past medical history of Depression, GAD (generalized anxiety disorder), History of iron deficiency anemia, Osteoarthritis, and Osteoporosis.   Outpatient Encounter Medications as of 04/30/2020  Medication Sig  . ALPRAZolam (XANAX) 0.5 MG tablet TAKE 1 TABLET BY MOUTH TWICE DAILY AS NEEDED FOR ANXIETY OR SLEEP. (Patient taking differently: Take 0.5 mg by mouth 2 (two) times daily as needed for anxiety. )  . buPROPion (WELLBUTRIN SR) 150 MG 12 hr tablet 1 bid (Patient taking differently: Take 150 mg by mouth 2 (two) times daily. 1 bid)  . OVER THE COUNTER MEDICATION Calcium and vit d  . [DISCONTINUED] Calcium-Magnesium-Vitamin D (CALCIUM 500 PO) Take 1 tablet by mouth 2 (two) times daily. Reported on 12/06/2015  . amoxicillin (AMOXIL) 500 MG capsule Take 1 capsule (500 mg total) by mouth 3 (three) times daily.  . [DISCONTINUED] loperamide (IMODIUM A-D) 2 MG tablet Take 0.5 tablets (1 mg total) by mouth daily with breakfast.   No facility-administered encounter medications on file as of 04/30/2020.     Review of Systems  Constitutional: Positive for fatigue. Negative for chills and fever.  HENT: Positive for congestion, postnasal drip, sinus pressure and sinus pain. Negative for ear  pain, rhinorrhea and sore throat.   Eyes: Negative for pain, discharge and itching.  Respiratory: Negative for cough, shortness of breath and wheezing.   Cardiovascular: Negative for chest pain and leg swelling.  Gastrointestinal: Negative for abdominal pain, diarrhea, nausea and vomiting.  Genitourinary: Negative for dysuria and frequency.  Musculoskeletal: Negative for arthralgias and back pain.  Skin: Negative for rash.  Neurological: Negative for dizziness, weakness and headaches.     Vitals Pulse 86   Temp 98.7 F (37.1 C) (Temporal)   Resp 12   SpO2 96%   Objective:   Physical Exam Vitals and nursing note reviewed.  Constitutional:      General: She is not in acute distress.    Appearance: Normal appearance. She is not ill-appearing or toxic-appearing.  HENT:     Head: Normocephalic and atraumatic.     Right Ear: Tympanic membrane, ear canal and external ear normal.     Left Ear: Tympanic membrane, ear canal and external ear normal.     Nose: Nose normal. No congestion or rhinorrhea.     Mouth/Throat:     Mouth: Mucous membranes are moist.     Pharynx: Oropharynx is clear. No oropharyngeal exudate or posterior oropharyngeal erythema.  Eyes:     Extraocular Movements: Extraocular movements intact.     Conjunctiva/sclera: Conjunctivae normal.     Pupils: Pupils are equal, round, and reactive to light.  Cardiovascular:     Rate and Rhythm: Normal rate and  regular rhythm.     Pulses: Normal pulses.     Heart sounds: Normal heart sounds. No murmur heard.   Pulmonary:     Effort: Pulmonary effort is normal. No respiratory distress.     Breath sounds: No wheezing.  Musculoskeletal:     Cervical back: Normal range of motion.  Lymphadenopathy:     Cervical: No cervical adenopathy.  Skin:    General: Skin is warm and dry.     Findings: No rash.  Neurological:     Mental Status: She is alert and oriented to person, place, and time.  Psychiatric:        Mood and  Affect: Mood normal.        Behavior: Behavior normal.      Assessment and Plan   1. Acute non-recurrent frontal sinusitis - amoxicillin (AMOXIL) 500 MG capsule; Take 1 capsule (500 mg total) by mouth 3 (three) times daily.  Dispense: 30 capsule; Refill: 0   Sinusitis- amox for 10 days, flonase, and netti-pot prn. tylenol /ibuprofen prn headaches. Negative covid test 5 days ago.  F/u prn.

## 2020-05-04 ENCOUNTER — Encounter: Payer: Self-pay | Admitting: Family Medicine

## 2020-05-05 ENCOUNTER — Telehealth (INDEPENDENT_AMBULATORY_CARE_PROVIDER_SITE_OTHER): Payer: Self-pay | Admitting: Internal Medicine

## 2020-05-05 NOTE — Telephone Encounter (Signed)
Dr.Rehman was givent he message and he will call the patient on Monday.

## 2020-05-05 NOTE — Telephone Encounter (Signed)
patient left voice mail message stating Dr Laural Golden was supposed to be calling her back - states she is out of town and would like for him to call her on Monday - ph# [phone]

## 2020-08-31 ENCOUNTER — Other Ambulatory Visit: Payer: Self-pay | Admitting: Family Medicine

## 2020-09-24 ENCOUNTER — Encounter: Payer: Self-pay | Admitting: Family Medicine

## 2020-09-25 ENCOUNTER — Other Ambulatory Visit: Payer: Self-pay | Admitting: Family Medicine

## 2020-09-25 MED ORDER — ALPRAZOLAM 0.5 MG PO TABS: ORAL_TABLET | ORAL | 1 refills | Status: DC

## 2020-10-20 DIAGNOSIS — L011 Impetiginization of other dermatoses: Secondary | ICD-10-CM | POA: Diagnosis not present

## 2020-10-20 DIAGNOSIS — L2089 Other atopic dermatitis: Secondary | ICD-10-CM | POA: Diagnosis not present

## 2020-10-20 DIAGNOSIS — L820 Inflamed seborrheic keratosis: Secondary | ICD-10-CM | POA: Diagnosis not present

## 2020-10-26 DIAGNOSIS — H52223 Regular astigmatism, bilateral: Secondary | ICD-10-CM | POA: Diagnosis not present

## 2020-10-26 DIAGNOSIS — H5203 Hypermetropia, bilateral: Secondary | ICD-10-CM | POA: Diagnosis not present

## 2020-10-26 DIAGNOSIS — H524 Presbyopia: Secondary | ICD-10-CM | POA: Diagnosis not present

## 2020-10-26 DIAGNOSIS — H25013 Cortical age-related cataract, bilateral: Secondary | ICD-10-CM | POA: Diagnosis not present

## 2020-11-03 ENCOUNTER — Other Ambulatory Visit: Payer: Self-pay | Admitting: Family Medicine

## 2020-11-03 DIAGNOSIS — Z0000 Encounter for general adult medical examination without abnormal findings: Secondary | ICD-10-CM

## 2020-11-25 ENCOUNTER — Ambulatory Visit (INDEPENDENT_AMBULATORY_CARE_PROVIDER_SITE_OTHER): Payer: Medicare Other | Admitting: Family Medicine

## 2020-11-25 ENCOUNTER — Encounter: Payer: Self-pay | Admitting: Family Medicine

## 2020-11-25 ENCOUNTER — Other Ambulatory Visit: Payer: Self-pay

## 2020-11-25 VITALS — Temp 98.2°F | Wt 127.7 lb

## 2020-11-25 DIAGNOSIS — W57XXXA Bitten or stung by nonvenomous insect and other nonvenomous arthropods, initial encounter: Secondary | ICD-10-CM

## 2020-11-25 DIAGNOSIS — R5383 Other fatigue: Secondary | ICD-10-CM

## 2020-11-25 DIAGNOSIS — S30860A Insect bite (nonvenomous) of lower back and pelvis, initial encounter: Secondary | ICD-10-CM

## 2020-11-25 MED ORDER — DOXYCYCLINE HYCLATE 100 MG PO TABS: 100.0000 mg | ORAL_TABLET | Freq: Two times a day (BID) | ORAL | 0 refills | Status: DC

## 2020-11-25 NOTE — Progress Notes (Signed)
Patient ID: Tiffany Perry, female    DOB: 28-Sep-1950, 70 y.o.   MRN: [id number]   Chief Complaint  Patient presents with  . tick removed Sunday    Started with chills yesterday-body aches and fatigue- no fever- also has stomach bug last week but that is resolved Covid test negative   Subjective:  CC: recent stomach bug and attached tick bite.   This is a new problem.  Presents today with concerns of had a stomach bug starting on April 17, violently vomiting on Sunday 17th this has resolved.  On April 24, found unattached tick that was likely attached for about 24 hours.  Has since then started feeling lightheadedness, dizziness, not feeling good, fatigue.  Denies rash, denies any redness at the site of the tick bite.  Tick was located in the left groin area.    Medical History Hazleigh has a past medical history of Depression, GAD (generalized anxiety disorder), History of iron deficiency anemia, Osteoarthritis, and Osteoporosis.   Outpatient Encounter Medications as of 11/25/2020  Medication Sig  . doxycycline (VIBRA-TABS) 100 MG tablet Take 1 tablet (100 mg total) by mouth 2 (two) times daily.  Marland Kitchen ALPRAZolam (XANAX) 0.5 MG tablet TAKE 1 TABLET BY MOUTH TWICE DAILY AS NEEDED FOR ANXIETY OR SLEEP.  Marland Kitchen buPROPion (WELLBUTRIN SR) 150 MG 12 hr tablet TAKE (1) TABLET BY MOUTH TWICE DAILY.  Marland Kitchen OVER THE COUNTER MEDICATION Calcium and vit d  . [DISCONTINUED] amoxicillin (AMOXIL) 500 MG capsule Take 1 capsule (500 mg total) by mouth 3 (three) times daily.   No facility-administered encounter medications on file as of 11/25/2020.     Review of Systems  Constitutional: Positive for appetite change, chills and fatigue. Negative for fever.       Has not resolved from stomach bug. Took care of grandson and caught from him.   Respiratory: Negative for shortness of breath.   Cardiovascular: Negative for chest pain.  Gastrointestinal: Positive for abdominal pain.       Possibly left over  from stomach bug last week.   Musculoskeletal: Positive for myalgias.       Severe yesterday   Skin: Negative for rash.     Vitals Temp 98.2 F (36.8 C) (Oral)   Wt 127 lb 11.2 oz (57.9 kg)   BMI 22.62 kg/m   Objective:   Physical Exam Vitals reviewed.  Constitutional:      Appearance: Normal appearance.  Cardiovascular:     Rate and Rhythm: Normal rate and regular rhythm.     Heart sounds: Normal heart sounds.  Pulmonary:     Effort: Pulmonary effort is normal.     Breath sounds: Normal breath sounds.  Skin:    General: Skin is warm and dry.     Comments: Reports tick was attached in left groin area (did not visualize) reports no erythema at site.   Neurological:     General: No focal deficit present.     Mental Status: She is alert.  Psychiatric:        Behavior: Behavior normal.      Assessment and Plan   1. Tick bite of pelvic region, initial encounter - doxycycline (VIBRA-TABS) 100 MG tablet; Take 1 tablet (100 mg total) by mouth 2 (two) times daily.  Dispense: 42 tablet; Refill: 0   Will treat with doxycycline for 21 days.  She is instructed that if her symptoms do not resolve, to complete antibiotic therapy and let the office know.  No lab  work indicated today.  Agrees with plan of care discussed today. Understands warning signs to seek further care: chest pain, shortness of breath, any significant change in health.  Understands to follow-up if symptoms do not improve, or worsen.  Encouraged to complete the full 21-day antibiotic regimen and pay close attention to her symptoms.  Pecolia Ades, NP 11/25/20

## 2020-11-25 NOTE — Patient Instructions (Signed)
Doxycycline oral tablets (Periodontitis) What is this medicine? DOXYCYCLINE (dox i SYE kleen) is a tetracycline antibiotic. It kills certain bacteria or stops their growth. This medicine is used to treat a dental infection called periodontitis. This medicine may be used for other purposes; ask your health care provider or pharmacist if you have questions. COMMON BRAND NAME(S): Oraxyl, Periostat What should I tell my health care provider before I take this medicine? They need to know if you have any of these conditions:  liver disease  long exposure to sunlight like working outdoors  stomach problems like colitis  an unusual or allergic reaction to doxycycline, tetracycline antibiotics, other medicines, foods, dyes, or preservatives  pregnant or trying to get pregnant  breast-feeding How should I use this medicine? Take this medicine by mouth with a full glass of water. Follow the directions on the prescription label. Take this medicine at least 1 hour before or 2 hours after food. Take your medicine at regular intervals. Do not take your medicine more often than directed. Take all of your medicine as directed even if you think you are better. Do not skip doses or stop your medicine early. Talk to your pediatrician regarding the use of this medicine in children. Special care may be needed. Overdosage: If you think you have taken too much of this medicine contact a poison control center or emergency room at once. NOTE: This medicine is only for you. Do not share this medicine with others. What if I miss a dose? If you miss a dose, take it as soon as you can. If it is almost time for your next dose, take only that dose. Do not take double or extra doses. What may interact with this medicine?  antacids  barbiturates  birth control pills  bismuth subsalicylate  carbamazepine  methoxyflurane  other antibiotics  phenytoin  vitamins that contain iron  warfarin This list may not  describe all possible interactions. Give your health care provider a list of all the medicines, herbs, non-prescription drugs, or dietary supplements you use. Also tell them if you smoke, drink alcohol, or use illegal drugs. Some items may interact with your medicine. What should I watch for while using this medicine? Tell your doctor or health care professional if your symptoms do not improve. Do not treat diarrhea with over the counter products. Contact your doctor if you have diarrhea that lasts more than 2 days or if it is severe and watery. Do not take this medicine just before going to bed. It may not dissolve properly when you lay down and can cause pain in your throat. Drink plenty of fluids while taking this medicine to also help reduce irritation in your throat. This medicine can make you more sensitive to the sun. Keep out of the sun. If you cannot avoid being in the sun, wear protective clothing and use sunscreen. Do not use sun lamps or tanning beds/booths. Birth control pills may not work properly while you are taking this medicine. Talk to your doctor about using an extra method of birth control. If you are being treated for a sexually transmitted infection, avoid sexual contact until you have finished your treatment. Your sexual partner may also need treatment. Avoid antacids, aluminum, calcium, magnesium, and iron products for 4 hours before and 2 hours after taking a dose of this medicine. What side effects may I notice from receiving this medicine? Side effects that you should report to your doctor or health care professional as soon as  possible:  allergic reactions like skin rash, itching or hives, swelling of the face, lips, or tongue  difficulty breathing  fever  itching in the rectal or genital area  pain on swallowing  rash, fever, and swollen lymph nodes  redness, blistering, peeling or loosening of the skin, including inside the mouth  severe stomach pain or  cramps  unusual bleeding or bruising  unusually weak or tired  yellowing of the eyes or skin Side effects that usually do not require medical attention (report to your doctor or health care professional if they continue or are bothersome):  diarrhea  loss of appetite  nausea, vomiting This list may not describe all possible side effects. Call your doctor for medical advice about side effects. You may report side effects to FDA at 1-800-FDA-1088. Where should I keep my medicine? Keep out of the reach of children. Store at room temperature between 15 and 30 degrees C (59 and 86 degrees F). Protect from light. Keep container tightly closed. Throw away any unused medicine after the expiration date. Taking this medicine after the expiration date can make you seriously ill. NOTE: This sheet is a summary. It may not cover all possible information. If you have questions about this medicine, talk to your doctor, pharmacist, or health care provider.  2021 Elsevier/Gold Standard (2018-10-17 13:38:11) Tick Bite Information, Adult  Ticks are insects that can bite. Most ticks live in shrubs and grassy areas. They climb onto people and animals that go by. Then they bite. Some ticks carry germs that can make you sick. How can I prevent tick bites? Take these steps: Use insect repellent  Use an insect repellent that has 20% or higher of the ingredients DEET, picaridin, or IR3535. Follow the instructions on the label. Put it on: ? Bare skin. ? The tops of your boots. ? Your pant legs. ? The ends of your sleeves.  If you use an insect repellent that has the ingredient permethrin, follow the instructions on the label. Put it on: ? Clothing. ? Boots. ? Supplies or outdoor gear. ? Tents. When you are outside  Wear long sleeves and long pants.  Wear light-colored clothes.  Tuck your pant legs into your socks.  Stay in the middle of the trail. Do not touch the bushes.  Avoid walking through  long grass.  Check for ticks on your clothes, hair, and skin often while you are outside. Before going inside your house, check your clothes, skin, head, neck, armpits, waist, groin, and joint areas. When you go indoors  Check your clothes for ticks. Dry your clothes in a dryer on high heat for 10 minutes or more. If clothes are damp, additional time may be needed.  Wash your clothes right away if they need to be washed. Use hot water.  Check your pets and outdoor gear.  Shower right away.  Check your body for ticks. Do a full body check using a mirror. What is the right way to remove a tick? Remove the tick from your skin as soon as possible. Do not remove the tick with your bare fingers.  To remove a tick that is crawling on your skin: ? Go outdoors and brush the tick off. ? Use tape or a lint roller.  To remove a tick that is biting: 1. Wash your hands. 2. If you have latex gloves, put them on. 3. Use tweezers, curved forceps, or a tick-removal tool to grasp the tick. Grasp the tick as close to your  skin and as close to the tick's head as possible. 4. Gently pull up until the tick lets go.  Try to keep the tick's head attached to its body.  Do not twist or jerk the tick.  Do not squeeze or crush the tick. Do not try to remove a tick with heat, alcohol, petroleum jelly, or fingernail polish.   What should I do after taking out a tick?  Throw away the tick. Do not crush a tick with your fingers.  Clean the bite area and your hands with soap and water, rubbing alcohol, or an iodine wash.  If an antiseptic cream or ointment is available, apply a small amount to the bite area.  Wash and disinfect any instruments that you used to remove the tick. How should I get rid of a live tick? To dispose of a live tick, use one of these methods:  Place the tick in rubbing alcohol.  Place the tick in a bag or container you can close tightly.  Wrap the tick tightly in tape.  Flush  the tick down the toilet. Contact a doctor if:  You have symptoms, such as: ? A fever or chills. ? A red rash that makes a circle (bull's-eye rash) in the bite area. ? Redness and swelling where the tick bit you. ? Headache. ? Pain in a muscle, joint, or bone. ? Being more tired than normal. ? Trouble walking or moving your legs. ? Numbness in your legs. ? Tender and swollen lymph glands.  A part of a tick breaks off and gets stuck in your skin. Get help right away if:  You cannot remove a tick.  You cannot move (have paralysis) or feel weak.  You are feeling worse or have new symptoms.  You find a tick that is biting you and filled with blood. This is important if you are in an area where diseases from ticks are common. Summary  Ticks may carry germs that can make you sick.  To prevent tick bites wear long sleeves, long pants, and light colors. Use insect repellent. Follow the instructions on the label.  If the tick is biting, do not try to remove it with heat, alcohol, petroleum jelly, or fingernail polish.  Use tweezers, curved forceps, or a tick-removal tool to grasp the tick. Gently pull up until the tick lets go. Do not twist or jerk the tick. Do not squeeze or crush the tick.  If you have symptoms, contact a doctor. This information is not intended to replace advice given to you by your health care provider. Make sure you discuss any questions you have with your health care provider. Document Revised: 07/14/2019 Document Reviewed: 07/14/2019 Elsevier Patient Education  2021 [employer].

## 2020-12-12 ENCOUNTER — Encounter: Payer: Self-pay | Admitting: Family Medicine

## 2020-12-13 ENCOUNTER — Other Ambulatory Visit: Payer: Self-pay

## 2020-12-13 ENCOUNTER — Ambulatory Visit (INDEPENDENT_AMBULATORY_CARE_PROVIDER_SITE_OTHER): Payer: Medicare Other | Admitting: Family Medicine

## 2020-12-13 DIAGNOSIS — U071 COVID-19: Secondary | ICD-10-CM | POA: Diagnosis not present

## 2020-12-13 MED ORDER — NIRMATRELVIR/RITONAVIR (PAXLOVID)TABLET: 3.0000 | ORAL_TABLET | Freq: Two times a day (BID) | ORAL | 0 refills | Status: AC

## 2020-12-13 MED ORDER — ONDANSETRON HCL 8 MG PO TABS
8.0000 mg | ORAL_TABLET | Freq: Three times a day (TID) | ORAL | 1 refills | Status: DC | PRN
Start: 2020-12-13 — End: 2021-03-22

## 2020-12-13 NOTE — Progress Notes (Signed)
   Subjective:    Patient ID: Tiffany Perry, female    DOB: May 17, 1951, 70 y.o.   MRN: [id number]  HPI  Patient presents today with respiratory illness Number of days present-  12/12/20  Symptoms include-cough, low grade fever, headache, body ache, nausea , congested Presence of worrisome signs (severe shortness of breath, lethargy, etc.) -none  Recent/current visit to urgent care or ER-no  Recent direct exposure to Covid-yes  Any current Covid testing-last night- positive  Patient with body aches headache fever cough denies wheezing or difficulty breathing O2 saturations at home range anywhere from 97% down to 92%.  Does relate fatigue and tiredness but denies any shortness of breath.  Patient has had COVID-vaccine but did not do booster Review of Systems See above    Objective:   Physical Exam  Cough is noted no respiratory distress O2 saturation 97% lungs clear respiratory rate normal heart regular      Assessment & Plan:  COVID Warning signs discussed Follow-up based on how things go Paxlovid as discussed with the patient stay away from Xanax while on this or only use half of the dose Recommend Zofran for nausea.  May stop doxycycline.  Follow-up sooner if any problems

## 2020-12-13 NOTE — Telephone Encounter (Signed)
Nurses  Please connect with [name] It is possible this could be COVID.  It is also possible it could be the flu although less likely.  #1 has patient done COVID test?  What were the results?  #2 care is primarily symptomatic but if she feels she is having a hard time I could see her at the end of this afternoon during the time that we typically see infectious disease issues  Less likely to be related to ticks but if she is feeling bad it would be wise for her to be checked out  Thanks-Dr. Nicki Reaper

## 2020-12-13 NOTE — Patient Instructions (Addendum)
Paxlovid was sent to [facility] drug.  This is a medication that you will utilize twice daily for 5 days.  Please know that if you use the Xanax while you are on this medicine you should reduce the Xanax to 1/2 tablet  Please let me know if you feel you are having any problems.   COVID-19: What to Do if You Are Sick If you have a fever, cough or other symptoms, you might have COVID-19. Most people have mild illness and are able to recover at home. If you are sick:  Keep track of your symptoms.  If you have an emergency warning sign (including trouble breathing), call 911. Steps to help prevent the spread of COVID-19 if you are sick If you are sick with COVID-19 or think you might have COVID-19, follow the steps below to care for yourself and to help protect other people in your home and community. Stay home except to get medical care  Stay home. Most people with COVID-19 have mild illness and can recover at home without medical care. Do not leave your home, except to get medical care. Do not visit public areas.  Take care of yourself. Get rest and stay hydrated. Take over-the-counter medicines, such as acetaminophen, to help you feel better.  Stay in touch with your doctor. Call before you get medical care. Be sure to get care if you have trouble breathing, or have any other emergency warning signs, or if you think it is an emergency.  Avoid public transportation, ride-sharing, or taxis. Separate yourself from other people As much as possible, stay in a specific room and away from other people and pets in your home. If possible, you should use a separate bathroom. If you need to be around other people or animals in or outside of the home, wear a mask. Tell your close contactsthat they may have been exposed to COVID-19. An infected person can spread COVID-19 starting 48 hours (or 2 days) before the person has any symptoms or tests positive. By letting your close contacts know they may have been  exposed to COVID-19, you are helping to protect everyone.  Additional guidance is available for those living in close quarters and shared housing.  See COVID-19 and Animals if you have questions about pets.  If you are diagnosed with COVID-19, someone from the health department may call you. Answer the call to slow the spread. Monitor your symptoms  Symptoms of COVID-19 include fever, cough, or other symptoms.  Follow care instructions from your healthcare provider and local health department. Your local health authorities may give instructions on checking your symptoms and reporting information. When to seek emergency medical attention Look for emergency warning signs* for COVID-19. If someone is showing any of these signs, seek emergency medical care immediately:  Trouble breathing  Persistent pain or pressure in the chest  New confusion  Inability to wake or stay awake  Pale, gray, or blue-colored skin, lips, or nail beds, depending on skin tone *This list is not all possible symptoms. Please call your medical provider for any other symptoms that are severe or concerning to you. Call 911 or call ahead to your local emergency facility: Notify the operator that you are seeking care for someone who has or may have COVID-19. Call ahead before visiting your doctor  Call ahead. Many medical visits for routine care are being postponed or done by phone or telemedicine.  If you have a medical appointment that cannot be postponed, call your doctor's  office, and tell them you have or may have COVID-19. This will help the office protect themselves and other patients. Get  tested  If you have symptoms of COVID-19, get tested. While waiting for test results, you stay away from others, including staying apart from those living in your household.  You can visit your state, tribal, local, and territorialhealth department's website to look for the latest local information on testing sites. If  you are sick, wear a mask over your nose and mouth  You should wear a mask over your nose and mouth if you must be around other people or animals, including pets (even at home).  You don't need to wear the mask if you are alone. If you can't put on a mask (because of trouble breathing, for example), cover your coughs and sneezes in some other way. Try to stay at least 6 feet away from other people. This will help protect the people around you.  Masks should not be placed on young children under age 24 years, anyone who has trouble breathing, or anyone who is not able to remove the mask without help. Note: During the COVID-19 pandemic, medical grade facemasks are reserved for healthcare workers and some first responders. Cover your coughs and sneezes  Cover your mouth and nose with a tissue when you cough or sneeze.  Throw away used tissues in a lined trash can.  Immediately wash your hands with soap and water for at least 20 seconds. If soap and water are not available, clean your hands with an alcohol-based hand sanitizer that contains at least 60% alcohol. Clean your hands often  Wash your hands often with soap and water for at least 20 seconds. This is especially important after blowing your nose, coughing, or sneezing; going to the bathroom; and before eating or preparing food.  Use hand sanitizer if soap and water are not available. Use an alcohol-based hand sanitizer with at least 60% alcohol, covering all surfaces of your hands and rubbing them together until they feel dry.  Soap and water are the best option, especially if hands are visibly dirty.  Avoid touching your eyes, nose, and mouth with unwashed hands.  Handwashing Tips Avoid sharing personal household items  Do not share dishes, drinking glasses, cups, eating utensils, towels, or bedding with other people in your home.  Wash these items thoroughly after using them with soap and water or put in the dishwasher. Clean all  "high-touch" surfaces everyday  Clean and disinfect high-touch surfaces in your "sick room" and bathroom; wear disposable gloves. Let someone else clean and disinfect surfaces in common areas, but you should clean your bedroom and bathroom, if possible.  If a caregiver or other person needs to clean and disinfect a sick person's bedroom or bathroom, they should do so on an as-needed basis. The caregiver/other person should wear a mask and disposable gloves prior to cleaning. They should wait as long as possible after the person who is sick has used the bathroom before coming in to clean and use the bathroom. ? High-touch surfaces include phones, remote controls, counters, tabletops, doorknobs, bathroom fixtures, toilets, keyboards, tablets, and bedside tables.  Clean and disinfect areas that may have blood, stool, or body fluids on them.  Use household cleaners and disinfectants. Clean the area or item with soap and water or another detergent if it is dirty. Then, use a household disinfectant. ? Be sure to follow the instructions on the label to ensure safe and effective use  of the product. Many products recommend keeping the surface wet for several minutes to ensure germs are killed. Many also recommend precautions such as wearing gloves and making sure you have good ventilation during use of the product. ? Use a product from [employer] List N: Disinfectants for Coronavirus (GYFVC-94). ? Complete Disinfection Guidance When you can be around others after being sick with COVID-19 Deciding when you can be around others is different for different situations. Find out when you can safely end home isolation. For any additional questions about your care, contact your healthcare provider or state or local health department. 10/15/2019 Content source: [facility] ([facility]), Division of Viral Diseases This information is not intended to replace advice given to you by  your health care provider. Make sure you discuss any questions you have with your health care provider. Document Revised: 05/31/2020 Document Reviewed: 05/31/2020 Elsevier Patient Education  2021 [employer].

## 2020-12-23 ENCOUNTER — Ambulatory Visit: Payer: Medicare Other

## 2020-12-28 DIAGNOSIS — S42001A Fracture of unspecified part of right clavicle, initial encounter for closed fracture: Secondary | ICD-10-CM | POA: Diagnosis not present

## 2020-12-28 DIAGNOSIS — M25511 Pain in right shoulder: Secondary | ICD-10-CM | POA: Diagnosis not present

## 2021-01-11 DIAGNOSIS — M25511 Pain in right shoulder: Secondary | ICD-10-CM | POA: Diagnosis not present

## 2021-02-02 DIAGNOSIS — S42001A Fracture of unspecified part of right clavicle, initial encounter for closed fracture: Secondary | ICD-10-CM | POA: Diagnosis not present

## 2021-02-15 ENCOUNTER — Other Ambulatory Visit: Payer: Self-pay

## 2021-02-15 ENCOUNTER — Ambulatory Visit
Admission: RE | Admit: 2021-02-15 | Discharge: 2021-02-15 | Disposition: A | Discharge status: Home / Self Care | Payer: Medicare Other | Source: Ambulatory Visit | Attending: Family Medicine | Admitting: Family Medicine

## 2021-02-15 DIAGNOSIS — Z0000 Encounter for general adult medical examination without abnormal findings: Secondary | ICD-10-CM

## 2021-02-15 DIAGNOSIS — Z1231 Encounter for screening mammogram for malignant neoplasm of breast: Secondary | ICD-10-CM | POA: Diagnosis not present

## 2021-03-02 DIAGNOSIS — S42001D Fracture of unspecified part of right clavicle, subsequent encounter for fracture with routine healing: Secondary | ICD-10-CM | POA: Diagnosis not present

## 2021-03-03 ENCOUNTER — Encounter: Payer: Self-pay | Admitting: Family Medicine

## 2021-03-03 DIAGNOSIS — M8580 Other specified disorders of bone density and structure, unspecified site: Secondary | ICD-10-CM

## 2021-03-03 DIAGNOSIS — Z780 Asymptomatic menopausal state: Secondary | ICD-10-CM

## 2021-03-04 NOTE — Addendum Note (Signed)
Addended by: Vicente Males on: 03/04/2021 10:18 AM   Modules accepted: Orders

## 2021-03-04 NOTE — Telephone Encounter (Signed)
Nurses please set up bone density Also may pass along message to [facility]   Dear Tiffany Perry Sorry to see that you are having such troubles.  I hope your clavicle does heal up.  More than likely they will need to put you through some physical therapy after your healing in order to gain good range of motion of your shoulder.  Our staff will help set up the bone density test.  When we get the results-depending on what they show-we may need to do a virtual or in person visit to discuss further.  Please take care if we can be of help let us know.  Thanks- Event organiser

## 2021-03-22 ENCOUNTER — Other Ambulatory Visit (INDEPENDENT_AMBULATORY_CARE_PROVIDER_SITE_OTHER): Payer: Medicare Other | Admitting: *Deleted

## 2021-03-22 ENCOUNTER — Other Ambulatory Visit: Payer: Self-pay

## 2021-03-22 DIAGNOSIS — Z0000 Encounter for general adult medical examination without abnormal findings: Secondary | ICD-10-CM | POA: Diagnosis not present

## 2021-03-22 NOTE — Progress Notes (Signed)
Subjective:    Patient ID: Tiffany Perry, female    DOB: 1951-05-13, 70 y.o.   MRN: [id number]  HPI    Medicare Annual Wellness Visit  03/22/2021  Subjective:  Tiffany Perry is a 70 y.o. female primary care patient of Luking, Elayne Snare, MD who had a Medicare Annual Wellness Visit today.  Tiffany Perry is Retired and lives with their spouse. Tiffany Perry has 4 children- 4 boys. she reports that she is socially active and does interact with friends/family regularly. she is moderately physically active and enjoys reading, exercising and playing with grandchildren  Patient Care Team: Kathyrn Drown, MD as PCP - General (Family Medicine)  Advanced Directives 03/22/2021 03/11/2020 03/10/2019 03/05/2019 01/13/2015  Does Patient Have a Medical Advance Directive? No Yes No No No  Type of Advance Directive - Healthcare Power of [place];Living will - - -  Copy of [place] in Chart? - No - copy requested - - -  Would patient like information on creating a medical advance directive? - - No - Patient declined - Yes - Educational materials given    Hospital Utilization Over the Past 12 Months: # of hospitalizations or ER visits: 0 # of surgeries: 0  Review of Systems    Patient reports that her overall health is worse compared to last year.  Patient states she has lost muscle tone and endurance since having to modify her exercise program due to collar bone fracture earlier in the year.   All other systems negative.  Pain Assessment Pain : 0-10 Pain Score: 2  Pain Type: Other (Comment), Chronic pain (collar bone discomfort s/p fracture) Pain Location:  (collar bone) Pain Descriptors / Indicators: Other (Comment) (discomfort s/p fracure of collar bone) Pain Onset: More than a month ago Pain Relieving Factors: tylenol as needed Effect of Pain on Daily Activities: none  Pain Relieving Factors: tylenol as needed  Current Medications & Allergies  (verified) Allergies as of 03/22/2021       Reactions   Codeine Nausea And Vomiting        Medication List        Accurate as of March 22, 2021  9:20 AM. If you have any questions, ask your nurse or doctor.          STOP taking these medications    doxycycline 100 MG tablet Commonly known as: VIBRA-TABS   ondansetron 8 MG tablet Commonly known as: Zofran       TAKE these medications    ALPRAZolam 0.5 MG tablet Commonly known as: XANAX TAKE 1 TABLET BY MOUTH TWICE DAILY AS NEEDED FOR ANXIETY OR SLEEP.   buPROPion 150 MG 12 hr tablet Commonly known as: WELLBUTRIN SR TAKE (1) TABLET BY MOUTH TWICE DAILY.   OVER THE COUNTER MEDICATION Calcium and vit d        History (reviewed): Past Medical History:  Diagnosis Date   Depression    GAD (generalized anxiety disorder)    History of iron deficiency anemia    Osteoarthritis    Osteoporosis    Past Surgical History:  Procedure Laterality Date   BIOPSY  03/11/2020   Procedure: BIOPSY;  Surgeon: Rogene Houston, MD;  [facility];  Service: Endoscopy;;  acending colon polyp    COLONOSCOPY  11/16/2011   Procedure: COLONOSCOPY;  Surgeon: Rogene Houston, MD;  [facility];  Service: Endoscopy;  Laterality: N/A;  730   COLONOSCOPY N/A 01/13/2015   Procedure: COLONOSCOPY;  Surgeon: Rogene Houston, MD;  [facility]: [facility];  Service: Endoscopy;  Laterality: N/A;  830   COLONOSCOPY N/A 03/11/2020   Procedure: COLONOSCOPY;  Surgeon: Rogene Houston, MD;  [facility]: [facility];  Service: Endoscopy;  Laterality: N/A;  Hughes Left    POLYPECTOMY  03/11/2020   Procedure: POLYPECTOMY;  Surgeon: Rogene Houston, MD;  [facility]: [facility];  Service: Endoscopy;;   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   Procedure: TOTAL KNEE ARTHROPLASTY;  Surgeon: Gaynelle Arabian, MD;  Location: [facility];  Service: Orthopedics;  Laterality: Right;  44mn    Family History  Problem Relation Age of Onset   Heart failure Mother    Heart failure Father    Hypertension Father    Heart attack Father    Colon cancer Cousin 556  Breast cancer Neg Hx    Social History   Socioeconomic History   Marital status: Married    Spouse name: TMarcello Moores  Number of children: 4   Years of education: Not on file   Highest education level: Bachelor's degree (e.g., BA, AB, BS)  Occupational History   Not on file  Tobacco Use   Smoking status: Never   Smokeless tobacco: Never  Vaping Use   Vaping Use: Never used  Substance and Sexual Activity   Alcohol use: No   Drug use: No   Sexual activity: Yes    Birth control/protection: Post-menopausal  Other Topics Concern   Not on file  Social History Narrative   Not on file   Social Determinants of Health   Financial Resource Strain: Not on file  Food Insecurity: Not on file  Transportation Needs: Not on file  Physical Activity: Not on file  Stress: Not on file  Social Connections: Not on file    Activities of Daily Living In your present state of health, do you have any difficulty performing the following activities: 03/22/2021  Hearing? Y  Vision? Y  Difficulty concentrating or making decisions? Y  Comment remembering at times  Walking or climbing stairs? N  Dressing or bathing? N  Doing errands, shopping? N  Preparing Food and eating ? N  Using the Toilet? N  In the past six months, have you accidently leaked urine? N  Do you have problems with loss of bowel control? Y  Comment some- had colonoscopy 2021  Managing your Medications? N  Managing your Finances? N  Housekeeping or managing your Housekeeping? N  Some recent data might be hidden    Patient Education/Literacy: How often do you need to have someone help you when you read instructions, pamphlets, or other written materials from your doctor or pharmacy?: 1 - Never What is the last grade level you completed in school?:  graduated from college  Exercise Current Exercise Habits: Structured exercise class (swim and walk and work out at [employer], Type of exercise: walking ([facility], Frequency (Times/Week): 4, Exercise limited by: Other - see comments (lmited swiming with collar bone pain)  Diet Patient reports consuming 3 meals a day and 2 snack(s) a day Patient reports that her primary diet is: Regular Patient reports that she does have regular access to food.   Depression Screen PHQ 2/9 Scores 03/22/2021 11/25/2020 04/16/2018  PHQ - 2 Score 0 0 0  PHQ- 9 Score - - 2     Fall Risk Fall Risk  03/22/2021 11/25/2020  Falls in the past  year? 1 0  Number falls in past yr: 0 -  Injury with Fall? 1 -  Comment fracture collar bone 5/22 -  Risk for fall due to : Impaired balance/gait No Fall Risks  Follow up Falls evaluation completed;Education provided Falls evaluation completed      Exercise Current Exercise Habits: Structured exercise class (swim and walk and work out at [employer]), Type of exercise: walking [facility]), Frequency (Times/Week): 4, Exercise limited by: Other - see comments (lmited swiming with collar bone pain)  Diet Patient reports consuming 3 meals a day and 2 snack(s) a day Patient reports that her primary diet is: Regular Patient reports that she does have regular access to food.   Depression Screen PHQ 2/9 Scores 03/22/2021 11/25/2020 04/16/2018  PHQ - 2 Score 0 0 0  PHQ- 9 Score - - 2     Fall Risk Fall Risk  03/22/2021 11/25/2020  Falls in the past year? 1 0  Number falls in past yr: 0 -  Injury with Fall? 1 -  Comment fracture collar bone 5/22 -  Risk for fall due to : Impaired balance/gait No Fall Risks  Follow up Falls evaluation completed;Education provided Falls evaluation completed     Objective:    Tiffany Perry was alert and able to participate appropriately during our visit.   Today's Vitals   03/22/21 0838  PainSc: 2    There is no height or weight on file to calculate  BMI.  Advanced Directives 03/22/2021 03/11/2020 03/10/2019 03/05/2019 01/13/2015  Does Patient Have a Medical Advance Directive? No Yes No No No  Type of Advance Directive - Healthcare Power of [place];Living will - - -  Copy of [place] in Chart? - No - copy requested - - -  Would patient like information on creating a medical advance directive? - - No - Patient declined - Yes - Educational materials given    Hearing/Vision  Tiffany Perry did not seem to have difficulty with hearing/understanding during the face to face visit but stated she has trouble hearing at times Reports that she has had a formal eye exam by an eye care professional within the past year Reports that she has not had a formal hearing evaluation within the past year  Cognitive Function: 6CIT Screen 03/22/2021  What Year? 0 points  What month? 0 points  What time? 0 points  Count back from 20 0 points  Months in reverse 0 points  Repeat phrase 0 points  Total Score 0   (Normal:0-7, Significant for Dysfunction: >8)  Normal Cognitive Function Screening: Yes  No flowsheet data found.   Immunization & Health Maintenance Record Immunization History  Administered Date(s) Administered   Influenza,inj,quad, With Preservative 08/31/2018   Influenza-Unspecified 10/02/2018   Moderna Sars-Covid-2 Vaccination 08/23/2019, 09/22/2019   Pneumococcal Polysaccharide-23 02/10/2020   Td 08/14/2005    Health Maintenance  Topic Date Due   Hepatitis C Screening  Never done   Zoster Vaccines- Shingrix (1 of 2) Never done   TETANUS/TDAP  08/15/2015   COVID-19 Vaccine (3 - Booster for Moderna series) 02/19/2020   PNA vac Low Risk Adult (2 of 2 - PCV13) 02/09/2021   INFLUENZA VACCINE  02/28/2021   MAMMOGRAM  02/16/2023   COLONOSCOPY (Pts 45-66yr Insurance coverage will need to be confirmed)  03/11/2025   DEXA SCAN  Completed   HPV VACCINES  Aged Out       Assessment:  This is a routine wellness examination  for [place]  Health Maintenance: Due or Overdue Health Maintenance Due  Topic Date Due   Hepatitis C Screening  Never done   Zoster Vaccines- Shingrix (1 of 2) Never done   TETANUS/TDAP  08/15/2015   COVID-19 Vaccine (3 - Booster for Moderna series) 02/19/2020   PNA vac Low Risk Adult (2 of 2 - PCV13) 02/09/2021   INFLUENZA VACCINE  02/28/2021    Tiffany Perry does not need a referral for Community Assistance: Care Management:   no Social Work:    no Prescription Assistance:  no Nutrition/Diabetes Education:  no   Plan:  Personalized Goals  Goals Addressed             This Visit's Progress    Activity and Exercise Increased       Patient states her goal is to ask for PT when her collar bone fracture is completely healed and get a personal trainer to get back into the shape she was in before her accident.       Personalized Health Maintenance & Screening Recommendations  UTD on Colonoscopy and Mammogram- bone density Test scheduled 04/01/21  Lung Cancer Screening Recommended: No (Low Dose CT Chest recommended if Age 46-80 years, 30 pack-year currently smoking OR have quit w/in past 15 years)   Advanced Directives: Written information was prepared per patient's request. Information packet given to patient  Referrals & Orders No orders of the defined types were placed in this encounter.   Follow-up Plan Follow-up with Kathyrn Drown, MD as planned   I have personally reviewed and noted the following in the patient's chart:   Medical and social history Use of alcohol, tobacco or illicit drugs  Current medications and supplements Functional ability and status Nutritional status Physical activity Advanced directives List of other physicians Hospitalizations, surgeries, and ER visits in previous 12 months Vitals Screenings to include cognitive, depression, and falls Referrals and appointments  In addition, I have reviewed and  discussed with Tiffany Perry certain preventive protocols, quality metrics, and best practice recommendations. A written personalized care plan for preventive services as well as general preventive health recommendations is available and can be mailed to the patient at her request.    AVS was printed and given to patient  Coralyn Pear RN  Review of Systems     Objective:   Physical Exam        Assessment & Plan:

## 2021-04-01 ENCOUNTER — Other Ambulatory Visit: Payer: Self-pay

## 2021-04-01 ENCOUNTER — Ambulatory Visit
Admission: RE | Admit: 2021-04-01 | Discharge: 2021-04-01 | Disposition: A | Discharge status: Home / Self Care | Payer: Medicare Other | Source: Ambulatory Visit | Attending: Family Medicine | Admitting: Family Medicine

## 2021-04-01 DIAGNOSIS — M810 Age-related osteoporosis without current pathological fracture: Secondary | ICD-10-CM | POA: Diagnosis not present

## 2021-04-01 DIAGNOSIS — Z780 Asymptomatic menopausal state: Secondary | ICD-10-CM | POA: Diagnosis not present

## 2021-04-01 DIAGNOSIS — M85852 Other specified disorders of bone density and structure, left thigh: Secondary | ICD-10-CM | POA: Diagnosis not present

## 2021-04-03 ENCOUNTER — Telehealth: Payer: Self-pay

## 2021-04-03 NOTE — Telephone Encounter (Signed)
Left detailed vm for patient to contact the [place] office during regular business hours to get scheduled for her AWV. Office contact information left on vm.

## 2021-04-25 ENCOUNTER — Other Ambulatory Visit: Payer: Self-pay | Admitting: Family Medicine

## 2021-05-04 DIAGNOSIS — S42001D Fracture of unspecified part of right clavicle, subsequent encounter for fracture with routine healing: Secondary | ICD-10-CM | POA: Diagnosis not present

## 2021-05-25 ENCOUNTER — Other Ambulatory Visit: Payer: Self-pay | Admitting: Family Medicine

## 2021-05-25 NOTE — Telephone Encounter (Signed)
Sent my chart message to schedule appointment 05/25/21

## 2021-05-26 ENCOUNTER — Ambulatory Visit (HOSPITAL_COMMUNITY): Payer: Medicare Other | Attending: Orthopedic Surgery

## 2021-05-26 ENCOUNTER — Encounter (HOSPITAL_COMMUNITY): Payer: Self-pay

## 2021-05-26 ENCOUNTER — Other Ambulatory Visit: Payer: Self-pay

## 2021-05-26 DIAGNOSIS — R29898 Other symptoms and signs involving the musculoskeletal system: Secondary | ICD-10-CM | POA: Insufficient documentation

## 2021-05-26 DIAGNOSIS — M25611 Stiffness of right shoulder, not elsewhere classified: Secondary | ICD-10-CM | POA: Insufficient documentation

## 2021-05-26 DIAGNOSIS — M25511 Pain in right shoulder: Secondary | ICD-10-CM | POA: Insufficient documentation

## 2021-05-26 NOTE — Patient Instructions (Signed)
Complete the following exercises 2-3 times a day.  Doorway Stretch  Place each hand opposite each other on the doorway. (You can change where you feel the stretch by moving arms higher or lower.) Step through with one foot and bend front knee until a stretch is felt and hold. Step through with the opposite foot on the next rep. Hold for __20-30___ seconds. Repeat __2__times.      http://orth.exer.us/944   Copyright  VHI. All rights reserved.   Internal Rotation Across Back  Grab the end of a towel with your affected side, palm facing backwards. Grab the towel with your unaffected side and pull your affected hand across your back until you feel a stretch in the front of your shoulder. If you feel pain, pull just to the pain, do not pull through the pain. Hold. Return your affected arm to your side. Try to keep your hand/arm close to your body during the entire movement.     Hold for 20-30 seconds. Complete 2 times.        Posterior Capsule Stretch   Stand or sit, one arm across body so hand rests over opposite shoulder. Gently push on crossed elbow with other hand until stretch is felt in shoulder of crossed arm. Hold _20-30__ seconds.  Repeat _2__ times per session. Do ___ sessions per day.   Wall Flexion  Slide your arm up the wall or door frame until a stretch is felt in your shoulder . Hold for 20-30 seconds. Complete 2 times     Shoulder Abduction Stretch  Stand side ways by a wall with affected up on wall. Gently step in toward wall to feel stretch. Hold for 20-30 seconds. Complete 2 times.

## 2021-05-30 NOTE — Therapy (Signed)
[place], [place], [address] Phone: [phone]   Fax:  [phone]  Occupational Therapy Evaluation  Patient Details  Name: Tiffany Perry MRN: [id number] Date of Birth: 08/15/1950 Referring Provider (OT): Netta Cedars, MD   Encounter Date: 05/26/2021    05/27/21 0907  OT Visits / Re-Eval  Visit Number 1  Number of Visits 12  Date for OT Re-Evaluation 07/07/21  Authorization  Authorization Type 1) Medicare 2) [employer] [place] [employer]  Authorization Time Period No authorization required  Progress Note Due on Visit 10  OT Time Calculation  OT Start Time 0950  OT Stop Time 1030  OT Time Calculation (min) 40 min  End of Session  Activity Tolerance Patient tolerated treatment well  Behavior During Therapy [facility] for tasks assessed/performed    Past Medical History:  Diagnosis Date   Depression    GAD (generalized anxiety disorder)    History of iron deficiency anemia    Osteoarthritis    Osteoporosis     Past Surgical History:  Procedure Laterality Date   BIOPSY  03/11/2020   Procedure: BIOPSY;  Surgeon: Rogene Houston, MD;  [facility];  Service: Endoscopy;;  acending colon polyp    COLONOSCOPY  11/16/2011   Procedure: COLONOSCOPY;  Surgeon: Rogene Houston, MD;  [facility];  Service: Endoscopy;  Laterality: N/A;  730   COLONOSCOPY N/A 01/13/2015   Procedure: COLONOSCOPY;  Surgeon: Rogene Houston, MD;  [facility];  Service: Endoscopy;  Laterality: N/A;  830   COLONOSCOPY N/A 03/11/2020   Procedure: COLONOSCOPY;  Surgeon: Rogene Houston, MD;  [facility];  Service: Endoscopy;  Laterality: N/A;  [place] Left    POLYPECTOMY  03/11/2020   Procedure: POLYPECTOMY;  Surgeon: Rogene Houston, MD;  [facility];  Service: Endoscopy;;   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   Procedure:  TOTAL KNEE ARTHROPLASTY;  Surgeon: Gaynelle Arabian, MD;  [facility];  Service: Orthopedics;  Laterality: Right;  64min    There were no vitals filed for this visit.    05/26/21 0955  Symptoms/Limitations  Subjective  S: I fell off a scooter when I was with my grandkids.  Pertinent History Patient is a 70 y/o female S/P right clavicle fractuce which occurred after sustaining a mechanical fall on 12/22/20. Dr. Clint Guy has referred patient to occupational therapy for evaluation and treatment.  Patient Stated Goals Get back to where she was. Be able to get up and down from the floor to play with Grandkids.  Pain Assessment  Currently in Pain? Other (Comment) (No pain at rest. Will experience pain/discomfort with freestyle swimming, pushing/pulling doors, etc. 2/10.)      05/26/21 0958  Assessment  Medical Diagnosis right clavicle fracture  Referring Provider (OT) Netta Cedars, MD  Onset Date/Surgical Date 12/22/20  Hand Dominance Right  Next MD Visit 06/15/21  Prior Therapy None for this referral.  Precautions  Precautions Shoulder  Type of Shoulder Precautions No lifting over 5lbs.  Restrictions  Weight Bearing Restrictions Yes  Balance Screen  Has the patient fallen in the past 6 months Yes  How many times? 2  Has the patient had a decrease in activity level because of a fear of falling?  No  Is the patient reluctant to leave their home because of a fear of falling?  No  Home  Environment  Family/patient expects to be discharged to: Private residence  Living Arrangements Spouse/significant other  Prior Function  Level of Harmony Retired  ADL  ADL comments Difficulty swimming (freestyle stroke), high level reaching, strength.  Mobility  Mobility Status Independent  Written Expression  Dominant Hand Right  Vision - History  Baseline Vision Wears glasses all the time  Cognition  Overall Cognitive Status Within Functional Limits for tasks  assessed  Observation/Other Assessments  Focus on Therapeutic Outcomes (FOTO)  complete next session  Posture/Postural Control  Posture/Postural Control No significant limitations  ROM / Strength  AROM / PROM / Strength AROM;PROM;Strength  Palpation  Palpation comment Minimal fascial restrictions noted in right upper trapezius and scapularis region.  AROM  AROM Assessment Site Shoulder  Overall AROM Comments Assessed seated. IR/er abducted.  Right/Left Shoulder Right  Right Shoulder ABduction 143 Degrees  Right Shoulder External Rotation 65 Degrees  Right Shoulder Internal Rotation 31 Degrees  Right Shoulder Flexion 146 Degrees  PROM  PROM Assessment Site Shoulder  Overall PROM Comments Assessed supine. IR/er abducted  Right/Left Shoulder Right  Right Shoulder Flexion 152 Degrees  Right Shoulder ABduction 180 Degrees  Right Shoulder External Rotation 60 Degrees  Right Shoulder Internal Rotation 60 Degrees  Strength  Strength Assessment Site Shoulder  Overall Strength Comments Assessed IR/er abducted  Right/Left Shoulder Right  Right Shoulder Flexion 4/5  Right Shoulder ABduction 4/5  Right Shoulder External Rotation 4/5  Right Shoulder Internal Rotation 4/5            05/27/21 0907  OT Education  Education Details shoulder stretches  Person(s) Educated Patient  Methods Explanation;Demonstration;Verbal cues;Handout  Comprehension Returned demonstration;Verbalized understanding                OT Short Term Goals - 05/30/21 2109       OT SHORT TERM GOAL #1   Title Patient will be educated and independent with HEP in order to faciliate her progress in therapy and utilize her RUE as her dominant extremity for daily tasks.    Time 6    Period Weeks    Status New    Target Date 07/07/21      OT SHORT TERM GOAL #2   Title Pt will increase her overall RUE shoulder and scapular strength to 5/5 in order to be able to get up and down from the floor while  playing with her grandkids.    Time 6    Period Weeks    Status New      OT SHORT TERM GOAL #3   Title Patient will increase her RUE shoulder A/ROM to WNL in order to complete all reaching activities such as behind her back and overhead with less difficulty.    Time 6    Period Weeks    Status New      OT SHORT TERM GOAL #4   Title Pt will decrease her RUE fascial restrictions to trace amount in order to increase the functional mobility needed to participate in all desired swim strokes.    Time 6    Period Weeks    Status New      OT SHORT TERM GOAL #5   Title Patient will report a pain score of approximately 2/10 or less when participating in all leisure and daily tasks using her RUE as her dominant extremity.    Time 6    Period Weeks    Status New  05/27/21 1028  OT Assessment and Plan  Clinical Impression Statement A: Patient is a 70 y/o female S/P right clavicle fracture causing increased pain, fascial restrictions, and decreased ROM and strength resulting in difficulty completing required daily and leisure activities.  OT Occupational Profile and History Problem Focused Assessment - Including review of records relating to presenting problem  Occupational performance deficits (Please refer to evaluation for details): ADL's;Leisure;IADL's  Pt will benefit from skilled therapeutic intervention in order to improve on the following performance deficits Body Structure / Function / Physical Skills  Body Structure / Function / Physical Skills ADL;UE functional use;Fascial restriction;ROM;Strength  Rehab Potential Excellent  Clinical Decision Making Low  Comorbidities Affecting Occupational Performance: None  Modification or Assistance to Complete Evaluation  No modification of tasks or assist necessary to complete eval  OT Frequency 2x / week  OT Duration 6 weeks  OT Treatment/Interventions Self-care/ADL training;Ultrasound;Patient/family  education;Cryotherapy;Electrical Stimulation;Moist Heat;Therapeutic exercise;Manual Therapy;Therapeutic activities;Neuromuscular education;Passive range of motion  Plan P: Patient will benefit from skilled OT services to increase functional use of her RUE and returning to using it for all daily and leisure tasks. Treatment plan: Myofascial release, passive stretching, A/ROM, scapular and shoulder strengthening and stability. Modalities PRN.  OT Home Exercise Plan eval: shoulder stretches  Consulted and Agree with Plan of Care Patient         Patient will benefit from skilled therapeutic intervention in order to improve the following deficits and impairments:   Body Structure / Function / Physical Skills: ADL, UE functional use, Fascial restriction, ROM, Strength       Visit Diagnosis: Other symptoms and signs involving the musculoskeletal system  Acute pain of right shoulder  Stiffness of right shoulder, not elsewhere classified    Problem List Patient Active Problem List   Diagnosis Date Noted   Tick bite of pelvic region 11/25/2020   Anxiety and depression 02/10/2020   OA (osteoarthritis) of knee 03/10/2019   Osteoarthritis of right knee 03/10/2019   Osteopenia 08/10/2016   Chronic depression 11/11/2014   JOINT PAIN, HAND 12/30/2008   TRIGGER FINGER 12/30/2008    Ailene Ravel, OTR/L,CBIS  [phone]  05/30/2021, 9:15 PM  [facility] [address] [place], [place], [address] Phone: [phone]   Fax:  [phone]  Name: Eustacia Urbanek MRN: [id number] Date of Birth: [birth year]

## 2021-05-31 ENCOUNTER — Encounter (HOSPITAL_COMMUNITY): Payer: Self-pay | Admitting: Occupational Therapy

## 2021-05-31 ENCOUNTER — Ambulatory Visit (HOSPITAL_COMMUNITY): Payer: Medicare Other | Attending: Orthopedic Surgery | Admitting: Occupational Therapy

## 2021-05-31 ENCOUNTER — Other Ambulatory Visit: Payer: Self-pay

## 2021-05-31 DIAGNOSIS — M25611 Stiffness of right shoulder, not elsewhere classified: Secondary | ICD-10-CM | POA: Diagnosis not present

## 2021-05-31 DIAGNOSIS — M25511 Pain in right shoulder: Secondary | ICD-10-CM | POA: Insufficient documentation

## 2021-05-31 DIAGNOSIS — R29898 Other symptoms and signs involving the musculoskeletal system: Secondary | ICD-10-CM | POA: Diagnosis not present

## 2021-05-31 NOTE — Patient Instructions (Signed)

## 2021-05-31 NOTE — Therapy (Signed)
[place], [place], [address] Phone: [phone]   Fax:  [phone]  Occupational Therapy Treatment  Patient Details  Name: Tiffany Perry MRN: [id number] Date of Birth: Aug 02, 1950 Referring Provider (OT): Netta Cedars, MD   Encounter Date: 05/31/2021   OT End of Session - 05/31/21 1121     Visit Number 2    Number of Visits 12    Date for OT Re-Evaluation 07/07/21    Authorization Type 1) Medicare 2) [employer] [place] [employer]    Authorization Time Period No authorization required    Progress Note Due on Visit 10    OT Start Time [phone]    OT Stop Time [phone]    OT Time Calculation (min) 39 min    Activity Tolerance Patient tolerated treatment well    Behavior During Therapy [facility] for tasks assessed/performed             Past Medical History:  Diagnosis Date   Depression    GAD (generalized anxiety disorder)    History of iron deficiency anemia    Osteoarthritis    Osteoporosis     Past Surgical History:  Procedure Laterality Date   BIOPSY  03/11/2020   Procedure: BIOPSY;  Surgeon: Rogene Houston, MD;  [facility];  Service: Endoscopy;;  acending colon polyp    COLONOSCOPY  11/16/2011   Procedure: COLONOSCOPY;  Surgeon: Rogene Houston, MD;  [facility];  Service: Endoscopy;  Laterality: N/A;  730   COLONOSCOPY N/A 01/13/2015   Procedure: COLONOSCOPY;  Surgeon: Rogene Houston, MD;  [facility];  Service: Endoscopy;  Laterality: N/A;  830   COLONOSCOPY N/A 03/11/2020   Procedure: COLONOSCOPY;  Surgeon: Rogene Houston, MD;  [facility];  Service: Endoscopy;  Laterality: N/A;  [place] Left    POLYPECTOMY  03/11/2020   Procedure: POLYPECTOMY;  Surgeon: Rogene Houston, MD;  [facility];  Service: Endoscopy;;   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   Procedure: TOTAL KNEE ARTHROPLASTY;   Surgeon: Gaynelle Arabian, MD;  [facility];  Service: Orthopedics;  Laterality: Right;  58min    There were no vitals filed for this visit.   Subjective Assessment - 05/31/21 0737     Subjective  S: I was very sore.    Currently in Pain? Yes    Pain Score 2     Pain Location Shoulder    Pain Orientation Right    Pain Descriptors / Indicators Discomfort;Sore    Pain Type Chronic pain    Pain Radiating Towards neck and down arm    Pain Onset More than a month ago    Pain Frequency Intermittent    Aggravating Factors  stretches, picking up something too heavy    Pain Relieving Factors rest, pain    Effect of Pain on Daily Activities min to mod effect on ADLs    Multiple Pain Sites No                [facility] OT Assessment - 05/31/21 0737       Assessment   Medical Diagnosis right clavicle fracture      Precautions   Precautions Shoulder    Type of Shoulder Precautions No lifting over 5lbs.  OT Treatments/Exercises (OP) - 05/31/21 0739       Exercises   Exercises Shoulder      Shoulder Exercises: Supine   Protraction PROM;5 reps    Horizontal ABduction PROM;5 reps    External Rotation PROM;5 reps    Internal Rotation PROM;5 reps    Flexion PROM;5 reps    ABduction PROM;5 reps      Shoulder Exercises: Standing   Protraction AROM;10 reps    Horizontal ABduction AROM;10 reps    External Rotation AROM;10 reps    Internal Rotation AROM;10 reps    Flexion AROM;10 reps    ABduction AROM;10 reps      Shoulder Exercises: ROM/Strengthening   Wall Wash 1'    Proximal Shoulder Strengthening, Seated 10X each, no rest breaks      Shoulder Exercises: Stretch   Corner Stretch 1 rep;10 seconds    Cross Chest Stretch 1 rep;10 seconds    Internal Rotation Stretch 1 rep   10", holding wrists   Wall Stretch - Flexion 1 rep;10 seconds    Wall Stretch - ABduction 1 rep;10 seconds      Manual Therapy   Manual Therapy Myofascial release     Manual therapy comments completed separately from therapeutic exercises    Myofascial Release myofascial release and manual techniques to right upper arm, trapezius, and scapular regions to decrease pain and fascial restrictions and increase joint ROM                    OT Education - 05/31/21 0804     Education Details A/ROM    Person(s) Educated Patient    Methods Explanation;Demonstration;Verbal cues;Handout    Comprehension Returned demonstration;Verbalized understanding              OT Short Term Goals - 05/31/21 0805       OT SHORT TERM GOAL #1   Title Patient will be educated and independent with HEP in order to faciliate her progress in therapy and utilize her RUE as her dominant extremity for daily tasks.    Time 6    Period Weeks    Status On-going    Target Date 07/07/21      OT SHORT TERM GOAL #2   Title Pt will increase her overall RUE shoulder and scapular strength to 5/5 in order to be able to get up and down from the floor while playing with her grandkids.    Time 6    Period Weeks    Status On-going      OT SHORT TERM GOAL #3   Title Patient will increase her RUE shoulder A/ROM to WNL in order to complete all reaching activities such as behind her back and overhead with less difficulty.    Time 6    Period Weeks    Status On-going      OT SHORT TERM GOAL #4   Title Pt will decrease her RUE fascial restrictions to trace amount in order to increase the functional mobility needed to participate in all desired swim strokes.    Time 6    Period Weeks    Status On-going      OT SHORT TERM GOAL #5   Title Patient will report a pain score of approximately 2/10 or less when participating in all leisure and daily tasks using her RUE as her dominant extremity.    Time 6    Period Weeks    Status On-going  Plan - 05/31/21 1122     Clinical Impression Statement A: Pt reports soreness with HEP, has continued to  complete and notices improvement in her ROM. Initiated myofascial release to RUE to address fascial restrictions, passive stretching completed. Reviewed shoulder stretches and pt demonstrates good form with completion. Pt completing A/ROM in standing, wall wash, and proximal shoulder strengthening. Verbal cuing for form and technique.    Body Structure / Function / Physical Skills ADL;UE functional use;Fascial restriction;ROM;Strength    Plan P: Follow up on HEP, continue with A/ROM adding x to v arms and scapular theraband    OT Home Exercise Plan eval: shoulder stretches; 11/1: shoulder A/ROM    Consulted and Agree with Plan of Care Patient             Patient will benefit from skilled therapeutic intervention in order to improve the following deficits and impairments:   Body Structure / Function / Physical Skills: ADL, UE functional use, Fascial restriction, ROM, Strength       Visit Diagnosis: Other symptoms and signs involving the musculoskeletal system  Acute pain of right shoulder  Stiffness of right shoulder, not elsewhere classified    Problem List Patient Active Problem List   Diagnosis Date Noted   Tick bite of pelvic region 11/25/2020   Anxiety and depression 02/10/2020   OA (osteoarthritis) of knee 03/10/2019   Osteoarthritis of right knee 03/10/2019   Osteopenia 08/10/2016   Chronic depression 11/11/2014   JOINT PAIN, HAND 12/30/2008   TRIGGER FINGER 12/30/2008    Guadelupe Sabin, OTR/L  [phone] 05/31/2021, 11:25 AM  [facility] [address] [place], [place], [address] Phone: [phone]   Fax:  [phone]  Name: Tiffany Perry MRN: [id number] Date of Birth: 25-Jan-1951

## 2021-06-02 ENCOUNTER — Ambulatory Visit (HOSPITAL_COMMUNITY): Payer: Medicare Other | Admitting: Occupational Therapy

## 2021-06-02 ENCOUNTER — Other Ambulatory Visit: Payer: Self-pay

## 2021-06-02 ENCOUNTER — Encounter (HOSPITAL_COMMUNITY): Payer: Self-pay | Admitting: Occupational Therapy

## 2021-06-02 DIAGNOSIS — M25511 Pain in right shoulder: Secondary | ICD-10-CM

## 2021-06-02 DIAGNOSIS — R29898 Other symptoms and signs involving the musculoskeletal system: Secondary | ICD-10-CM

## 2021-06-02 DIAGNOSIS — M25611 Stiffness of right shoulder, not elsewhere classified: Secondary | ICD-10-CM

## 2021-06-02 NOTE — Therapy (Signed)
[place], [place], [address] Phone: [phone]   Fax:  [phone]  Occupational Therapy Treatment  Patient Details  Name: Tiffany Perry MRN: [id number] Date of Birth: 12-16-1950 Referring Provider (OT): Netta Cedars, MD   Encounter Date: 06/02/2021   OT End of Session - 06/02/21 0814     Visit Number 3    Number of Visits 12    Date for OT Re-Evaluation 07/07/21    Authorization Type 1) Medicare 2) [employer] [place] [employer]    Authorization Time Period No authorization required    Progress Note Due on Visit 10    OT Start Time [phone]    OT Stop Time [phone]    OT Time Calculation (min) 40 min    Activity Tolerance Patient tolerated treatment well    Behavior During Therapy [facility] for tasks assessed/performed             Past Medical History:  Diagnosis Date   Depression    GAD (generalized anxiety disorder)    History of iron deficiency anemia    Osteoarthritis    Osteoporosis     Past Surgical History:  Procedure Laterality Date   BIOPSY  03/11/2020   Procedure: BIOPSY;  Surgeon: Rogene Houston, MD;  [facility];  Service: Endoscopy;;  acending colon polyp    COLONOSCOPY  11/16/2011   Procedure: COLONOSCOPY;  Surgeon: Rogene Houston, MD;  [facility];  Service: Endoscopy;  Laterality: N/A;  730   COLONOSCOPY N/A 01/13/2015   Procedure: COLONOSCOPY;  Surgeon: Rogene Houston, MD;  [facility];  Service: Endoscopy;  Laterality: N/A;  830   COLONOSCOPY N/A 03/11/2020   Procedure: COLONOSCOPY;  Surgeon: Rogene Houston, MD;  [facility];  Service: Endoscopy;  Laterality: N/A;  [place] Left    POLYPECTOMY  03/11/2020   Procedure: POLYPECTOMY;  Surgeon: Rogene Houston, MD;  [facility];  Service: Endoscopy;;   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   Procedure: TOTAL KNEE ARTHROPLASTY;   Surgeon: Gaynelle Arabian, MD;  [facility];  Service: Orthopedics;  Laterality: Right;  31min    There were no vitals filed for this visit.   Subjective Assessment - 06/02/21 0735     Subjective  S: It's feeling a lot better.    Currently in Pain? No/denies                          OT Treatments/Exercises (OP) - 06/02/21 0735       Exercises   Exercises Shoulder      Shoulder Exercises: Supine   Protraction PROM;5 reps    Horizontal ABduction PROM;5 reps    External Rotation PROM;5 reps    Internal Rotation PROM;5 reps    Flexion PROM;5 reps    ABduction PROM;5 reps      Shoulder Exercises: Standing   Protraction AROM;10 reps    Horizontal ABduction AROM;10 reps    External Rotation AROM;10 reps    Internal Rotation AROM;10 reps    Flexion AROM;10 reps    ABduction AROM;10 reps    Extension Theraband;10 reps    Theraband Level (Shoulder Extension) Level 2 (Red)    Row Theraband;10 reps    Theraband Level (Shoulder Row) Level 2 (Red)    Retraction Theraband;10 reps  Theraband Level (Shoulder Retraction) Level 2 (Red)      Shoulder Exercises: ROM/Strengthening   UBE (Upper Arm Bike) Level 1 2' forward 2' reverse, pace: 5.0    Over Head Lace 2' seated    X to V Arms 10X    Proximal Shoulder Strengthening, Seated 10X each, no rest breaks      Manual Therapy   Manual Therapy Myofascial release    Manual therapy comments completed separately from therapeutic exercises    Myofascial Release myofascial release and manual techniques to right upper arm, trapezius, and scapular regions to decrease pain and fascial restrictions and increase joint ROM                      OT Short Term Goals - 05/31/21 0805       OT SHORT TERM GOAL #1   Title Patient will be educated and independent with HEP in order to faciliate her progress in therapy and utilize her RUE as her dominant extremity for daily tasks.    Time 6    Period Weeks    Status  On-going    Target Date 07/07/21      OT SHORT TERM GOAL #2   Title Pt will increase her overall RUE shoulder and scapular strength to 5/5 in order to be able to get up and down from the floor while playing with her grandkids.    Time 6    Period Weeks    Status On-going      OT SHORT TERM GOAL #3   Title Patient will increase her RUE shoulder A/ROM to WNL in order to complete all reaching activities such as behind her back and overhead with less difficulty.    Time 6    Period Weeks    Status On-going      OT SHORT TERM GOAL #4   Title Pt will decrease her RUE fascial restrictions to trace amount in order to increase the functional mobility needed to participate in all desired swim strokes.    Time 6    Period Weeks    Status On-going      OT SHORT TERM GOAL #5   Title Patient will report a pain score of approximately 2/10 or less when participating in all leisure and daily tasks using her RUE as her dominant extremity.    Time 6    Period Weeks    Status On-going                      Plan - 06/02/21 0757     Clinical Impression Statement A: Pt reports improvement in soreness and in functioning, has been completing HEP. Continued with myofascial release and passive stretching, A/ROM in standing. Pt demonstrating ROM WNL today. Added x to v arms and scapular theraband, overhead lacing and UBE this session, verbal cuing for form and technique.    Body Structure / Function / Physical Skills ADL;UE functional use;Fascial restriction;ROM;Strength    Plan P: increase repetitions to 15, add ball on wall and update HEP for scapular theraband    OT Home Exercise Plan eval: shoulder stretches; 11/1: shoulder A/ROM    Consulted and Agree with Plan of Care Patient             Patient will benefit from skilled therapeutic intervention in order to improve the following deficits and impairments:   Body Structure / Function / Physical Skills: ADL, UE functional use, Fascial  restriction,  ROM, Strength       Visit Diagnosis: Other symptoms and signs involving the musculoskeletal system  Acute pain of right shoulder  Stiffness of right shoulder, not elsewhere classified    Problem List Patient Active Problem List   Diagnosis Date Noted   Tick bite of pelvic region 11/25/2020   Anxiety and depression 02/10/2020   OA (osteoarthritis) of knee 03/10/2019   Osteoarthritis of right knee 03/10/2019   Osteopenia 08/10/2016   Chronic depression 11/11/2014   JOINT PAIN, HAND 12/30/2008   TRIGGER FINGER 12/30/2008    Guadelupe Sabin, OTR/L  [phone] 06/02/2021, 8:15 AM  [facility] [address] [place], [place], [address] Phone: [phone]   Fax:  [phone]  Name: Tiffany Perry MRN: [id number] Date of Birth: 08-19-1950

## 2021-06-03 ENCOUNTER — Other Ambulatory Visit: Payer: Self-pay | Admitting: Nurse Practitioner

## 2021-06-03 ENCOUNTER — Ambulatory Visit (HOSPITAL_COMMUNITY): Payer: Medicare Other | Admitting: Physical Therapy

## 2021-06-03 ENCOUNTER — Telehealth: Payer: Self-pay | Admitting: Family Medicine

## 2021-06-03 MED ORDER — BUPROPION HCL ER (SR) 150 MG PO TB12: ORAL_TABLET | ORAL | 0 refills | Status: DC

## 2021-06-03 NOTE — Telephone Encounter (Signed)
Patient left vm yesterday requesting refill on her wellbutrin she has scheduled an appt.  CB# [phone]

## 2021-06-03 NOTE — Telephone Encounter (Signed)
Please advise. Thank you

## 2021-06-03 NOTE — Telephone Encounter (Signed)
Done

## 2021-06-07 ENCOUNTER — Encounter (HOSPITAL_COMMUNITY): Payer: Medicare Other | Admitting: Occupational Therapy

## 2021-06-09 ENCOUNTER — Ambulatory Visit (HOSPITAL_COMMUNITY): Payer: Medicare Other

## 2021-06-09 ENCOUNTER — Other Ambulatory Visit: Payer: Self-pay

## 2021-06-09 ENCOUNTER — Encounter (HOSPITAL_COMMUNITY): Payer: Self-pay

## 2021-06-09 DIAGNOSIS — M25611 Stiffness of right shoulder, not elsewhere classified: Secondary | ICD-10-CM

## 2021-06-09 DIAGNOSIS — M25511 Pain in right shoulder: Secondary | ICD-10-CM

## 2021-06-09 DIAGNOSIS — R29898 Other symptoms and signs involving the musculoskeletal system: Secondary | ICD-10-CM

## 2021-06-09 NOTE — Patient Instructions (Signed)
1) (Home) Extension: Isometric / Bilateral Arm Retraction - Sitting   Facing anchor, hold hands and elbow at shoulder height, with elbow bent.  Pull arms back to squeeze shoulder blades together. Repeat 10-15 times. 1-2 times/day.   2) (Clinic) Extension / Flexion (Assist)   Face anchor, pull arms back, keeping elbow straight, and squeze shoulder blades together. Repeat 10-15 times. 1-2 times/day.   Copyright  [employer]. All rights reserved.   3) (Home) Retraction: Row - Bilateral (Anchor)   Facing anchor, arms reaching forward, pull hands toward stomach, keeping elbows bent and at your sides and pinching shoulder blades together. Repeat 10-15 times. 1-2 times/day.   Copyright  VHI. All rights reserved.   

## 2021-06-09 NOTE — Therapy (Signed)
[place], [place], [address] Phone: [phone]   Fax:  [phone]  Occupational Therapy Treatment  Patient Details  Name: Tiffany Perry MRN: [id number] Date of Birth: July 09, 1951 Referring Provider (OT): Netta Cedars, MD   Encounter Date: 06/09/2021   OT End of Session - 06/09/21 1637     Visit Number 4    Number of Visits 12    Date for OT Re-Evaluation 07/07/21    Authorization Type 1) Medicare 2) [employer] [place] [employer]    Authorization Time Period No authorization required    Progress Note Due on Visit 10    OT Start Time 1600    OT Stop Time 1638    OT Time Calculation (min) 38 min    Activity Tolerance Patient tolerated treatment well    Behavior During Therapy [facility] for tasks assessed/performed             Past Medical History:  Diagnosis Date   Depression    GAD (generalized anxiety disorder)    History of iron deficiency anemia    Osteoarthritis    Osteoporosis     Past Surgical History:  Procedure Laterality Date   BIOPSY  03/11/2020   Procedure: BIOPSY;  Surgeon: Rogene Houston, MD;  [facility];  Service: Endoscopy;;  acending colon polyp    COLONOSCOPY  11/16/2011   Procedure: COLONOSCOPY;  Surgeon: Rogene Houston, MD;  [facility];  Service: Endoscopy;  Laterality: N/A;  730   COLONOSCOPY N/A 01/13/2015   Procedure: COLONOSCOPY;  Surgeon: Rogene Houston, MD;  [facility];  Service: Endoscopy;  Laterality: N/A;  830   COLONOSCOPY N/A 03/11/2020   Procedure: COLONOSCOPY;  Surgeon: Rogene Houston, MD;  [facility];  Service: Endoscopy;  Laterality: N/A;  [place] Left    POLYPECTOMY  03/11/2020   Procedure: POLYPECTOMY;  Surgeon: Rogene Houston, MD;  [facility];  Service: Endoscopy;;   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   Subjective Assessment - 06/09/21 1618     Subjective  S: It still feels a little tight in my neck at times.    Currently in Pain? No/denies                [facility] OT Assessment - 06/09/21 1618       Assessment   Medical Diagnosis right clavicle fracture      Precautions   Precautions Shoulder    Type of Shoulder Precautions No lifting over 5lbs.                      OT Treatments/Exercises (OP) - 06/09/21 1619       Exercises   Exercises Shoulder      Shoulder Exercises: Supine   Protraction PROM;5 reps    Horizontal ABduction PROM;5 reps    External Rotation PROM;5 reps   abducted   Internal Rotation PROM;5 reps   abducted   Flexion PROM;5 reps    ABduction PROM;5 reps      Shoulder Exercises: Standing   Horizontal ABduction AROM;15 reps    External Rotation AROM;15 reps   approximately  30-45 degrees abducted   Internal Rotation AROM;15 reps   approximately 30-45 degrees abducted   Flexion AROM;15 reps    ABduction AROM;15 reps    Extension Theraband;15 reps    Theraband Level (Shoulder Extension) Level 2 (Red)    Row Theraband;15 reps    Theraband Level (Shoulder Row) Level 2 (Red)    Retraction Theraband;15 reps    Theraband Level (Shoulder Retraction) Level 2 (Red)      Shoulder Exercises: ROM/Strengthening   UBE (Upper Arm Bike) Level 2 2' forward 2' reverse, pace: 6.0    X to V Arms 12X      Manual Therapy   Manual Therapy Myofascial release    Manual therapy comments completed separately from therapeutic exercises    Myofascial Release myofascial release and manual techniques to right upper arm, trapezius, and scapular regions to decrease pain and fascial restrictions and increase joint ROM                    OT Education - 06/09/21 1636     Education Details scapular  strengthening - red band    Person(s) Educated Patient    Methods Explanation;Demonstration;Verbal cues;Handout    Comprehension Returned demonstration;Verbalized understanding              OT Short Term Goals - 05/31/21 0805       OT SHORT TERM GOAL #1   Title Patient will be educated and independent with HEP in order to faciliate her progress in therapy and utilize her RUE as her dominant extremity for daily tasks.    Time 6    Period Weeks    Status On-going    Target Date 07/07/21      OT SHORT TERM GOAL #2   Title Pt will increase her overall RUE shoulder and scapular strength to 5/5 in order to be able to get up and down from the floor while playing with her grandkids.    Time 6    Period Weeks    Status On-going      OT SHORT TERM GOAL #3   Title Patient will increase her RUE shoulder A/ROM to WNL in order to complete all reaching activities such as behind her back and overhead with less difficulty.    Time 6    Period Weeks    Status On-going      OT SHORT TERM GOAL #4   Title Pt will decrease her RUE fascial restrictions to trace amount in order to increase the functional mobility needed to participate in all desired swim strokes.    Time 6    Period Weeks    Status On-going      OT SHORT TERM GOAL #5   Title Patient will report a pain score of approximately 2/10 or less when participating in all leisure and daily tasks using her RUE as her dominant extremity.    Time 6    Period Weeks    Status On-going                      Plan - 06/09/21 1637     Clinical Impression Statement A: Myofascial release completed to right upper arm and upper trapezius to address min fascial restrictions. Increased standing repetitions to 15 and provided scapular strengthening exercises to HEP. VC for form and technique were provided.    Body Structure / Function / Physical Skills ADL;UE functional use;Fascial restriction;ROM;Strength    Plan P: Measure for  MD  appointment and send measurements.  Follow up on HEP. Continue with myofascial release. ball on the wall. scapular strengthening    Consulted and Agree with Plan of Care Patient             Patient will benefit from skilled therapeutic intervention in order to improve the following deficits and impairments:   Body Structure / Function / Physical Skills: ADL, UE functional use, Fascial restriction, ROM, Strength       Visit Diagnosis: Other symptoms and signs involving the musculoskeletal system  Stiffness of right shoulder, not elsewhere classified  Acute pain of right shoulder    Problem List Patient Active Problem List   Diagnosis Date Noted   Tick bite of pelvic region 11/25/2020   Anxiety and depression 02/10/2020   OA (osteoarthritis) of knee 03/10/2019   Osteoarthritis of right knee 03/10/2019   Osteopenia 08/10/2016   Chronic depression 11/11/2014   JOINT PAIN, HAND 12/30/2008   TRIGGER FINGER 12/30/2008   Ailene Ravel, OTR/L,CBIS  [phone]  06/09/2021, 4:41 PM  [facility] [address] [place], [place], [address] Phone: [phone]   Fax:  [phone]  Name: Tiffany Perry MRN: [id number] Date of Birth: [birth year]

## 2021-06-10 ENCOUNTER — Ambulatory Visit: Payer: Medicare Other | Admitting: Nurse Practitioner

## 2021-06-13 ENCOUNTER — Encounter (HOSPITAL_COMMUNITY): Payer: Self-pay

## 2021-06-13 ENCOUNTER — Ambulatory Visit (HOSPITAL_COMMUNITY): Payer: Medicare Other

## 2021-06-13 ENCOUNTER — Other Ambulatory Visit: Payer: Self-pay

## 2021-06-13 DIAGNOSIS — M25511 Pain in right shoulder: Secondary | ICD-10-CM

## 2021-06-13 DIAGNOSIS — R29898 Other symptoms and signs involving the musculoskeletal system: Secondary | ICD-10-CM

## 2021-06-13 DIAGNOSIS — M25611 Stiffness of right shoulder, not elsewhere classified: Secondary | ICD-10-CM

## 2021-06-14 NOTE — Therapy (Addendum)
[place], [place], [address] Phone: [phone]   Fax:  [phone]  Occupational Therapy Treatment  Patient Details  Name: Tiffany Perry MRN: [id number] Date of Birth: 1951-03-11 Referring Provider (OT): Netta Cedars, MD   Encounter Date: 06/13/2021   OT End of Session - 06/14/21 1017     Visit Number 5    Number of Visits 12    Date for OT Re-Evaluation 07/07/21    Authorization Type 1) Medicare 2) [employer] [place] [employer]    Authorization Time Period No authorization required    Progress Note Due on Visit 10    OT Start Time 1348    OT Stop Time 1426    OT Time Calculation (min) 38 min    Activity Tolerance Patient tolerated treatment well    Behavior During Therapy [facility] for tasks assessed/performed             Past Medical History:  Diagnosis Date   Depression    GAD (generalized anxiety disorder)    History of iron deficiency anemia    Osteoarthritis    Osteoporosis     Past Surgical History:  Procedure Laterality Date   BIOPSY  03/11/2020   Procedure: BIOPSY;  Surgeon: Rogene Houston, MD;  [facility];  Service: Endoscopy;;  acending colon polyp    COLONOSCOPY  11/16/2011   Procedure: COLONOSCOPY;  Surgeon: Rogene Houston, MD;  [facility];  Service: Endoscopy;  Laterality: N/A;  730   COLONOSCOPY N/A 01/13/2015   Procedure: COLONOSCOPY;  Surgeon: Rogene Houston, MD;  [facility];  Service: Endoscopy;  Laterality: N/A;  830   COLONOSCOPY N/A 03/11/2020   Procedure: COLONOSCOPY;  Surgeon: Rogene Houston, MD;  [facility];  Service: Endoscopy;  Laterality: N/A;  [place] Left    POLYPECTOMY  03/11/2020   Procedure: POLYPECTOMY;  Surgeon: Rogene Houston, MD;  [facility];  Service: Endoscopy;;   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   Procedure: TOTAL KNEE ARTHROPLASTY;   Surgeon: Gaynelle Arabian, MD;  [facility];  Service: Orthopedics;  Laterality: Right;  66mn    There were no vitals filed for this visit.   Subjective Assessment - 06/13/21 1352     Subjective  S: My neck just started hurting me on Saturday. I don't know what I did. It was a busy day.    Currently in Pain? Yes    Pain Score 4     Pain Location Neck    Pain Orientation Right;Lateral    Pain Descriptors / Indicators Sore;Constant    Pain Type Acute pain    Pain Onset In the past 7 days    Pain Frequency Constant    Aggravating Factors  nothing. Just busy weekend.    Pain Relieving Factors heat    Effect of Pain on Daily Activities moderate effect                [facility] OT Assessment - 06/13/21 1402       Assessment   Medical Diagnosis right clavicle fracture      Precautions   Precautions Shoulder      ROM / Strength   AROM / PROM / Strength AROM;PROM;Strength      Palpation   Palpation comment Minimal fascial restrictions noted in right upper trapezius and scapularis region.  AROM   Overall AROM Comments Assessed seated. IR/er abducted.    AROM Assessment Site Shoulder    Right/Left Shoulder Right    Right Shoulder Flexion 160 Degrees   previous: 146   Right Shoulder ABduction 165 Degrees   previous: 143   Right Shoulder Internal Rotation 35 Degrees   previous: 31   Right Shoulder External Rotation 90 Degrees   previous: 65     PROM   Overall PROM Comments Assessed supine. IR/er abducted    PROM Assessment Site Shoulder    Right/Left Shoulder Right    Right Shoulder Flexion 162 Degrees   previous: 152   Right Shoulder ABduction 180 Degrees   previous: same   Right Shoulder Internal Rotation 90 Degrees   previous: 60   Right Shoulder External Rotation 90 Degrees   previous: 60     Strength   Overall Strength Comments Assessed IR/er abducted    Strength Assessment Site Shoulder    Right/Left Shoulder Right    Right Shoulder Flexion 5/5   previous:  4/5   Right Shoulder ABduction 5/5   previous: 4/5   Right Shoulder Internal Rotation 5/5   previous: 4/5   Right Shoulder External Rotation 5/5   previous: 4/5                             OT Education - 06/14/21 1805     Education Details progress internal rotation stretch while holding band vertical versus horizontal .    Person(s) Educated Patient    Methods Explanation;Demonstration    Comprehension Verbalized understanding;Returned demonstration              OT Short Term Goals - 06/13/21 1412       OT SHORT TERM GOAL #1   Title Patient will be educated and independent with HEP in order to faciliate her progress in therapy and utilize her RUE as her dominant extremity for daily tasks.    Time 6    Period Weeks    Status Achieved    Target Date 07/07/21      OT SHORT TERM GOAL #2   Title Pt will increase her overall RUE shoulder and scapular strength to 5/5 in order to be able to get up and down from the floor while playing with her grandkids.    Time 6    Period Weeks    Status Achieved      OT SHORT TERM GOAL #3   Title Patient will increase her RUE shoulder A/ROM to WNL in order to complete all reaching activities such as behind her back and overhead with less difficulty.    Baseline 11/14: Continues to have ROM limitations with internal rotation.    Time 6    Period Weeks    Status Partially Met      OT SHORT TERM GOAL #4   Title Pt will decrease her RUE fascial restrictions to trace amount in order to increase the functional mobility needed to participate in all desired swim strokes.    Time 6    Period Weeks    Status On-going      OT SHORT TERM GOAL #5   Title Patient will report a pain score of approximately 2/10 or less when participating in all leisure and daily tasks using her RUE as her dominant extremity.    Time 6    Period Weeks    Status On-going  Plan - 06/14/21 1018     Clinical  Impression Statement A: Measurements taken for MD follow up appointment. patient is demonstrating increases with A/ROM. She is able to demonstrate full A/ROM for all shoulder movements except external rotation when shoulder abducted. Shoulder strength is now 5/5 in all ranges. Updated shoulder stretch for internal rotation. Provided education on self myofascial release technique with use of tennis ball, curved handle cane, and/or trigger point cane. VC for form and technique were provided.    Body Structure / Function / Physical Skills ADL;UE functional use;Fascial restriction;ROM;Strength    Plan P: follow up on MD appointment. Continue with myofascial release, ball on the wall and scapular strengthening.    Consulted and Agree with Plan of Care Patient             Patient will benefit from skilled therapeutic intervention in order to improve the following deficits and impairments:   Body Structure / Function / Physical Skills: ADL, UE functional use, Fascial restriction, ROM, Strength       Visit Diagnosis: Other symptoms and signs involving the musculoskeletal system  Acute pain of right shoulder  Stiffness of right shoulder, not elsewhere classified    Problem List Patient Active Problem List   Diagnosis Date Noted   Tick bite of pelvic region 11/25/2020   Anxiety and depression 02/10/2020   OA (osteoarthritis) of knee 03/10/2019   Osteoarthritis of right knee 03/10/2019   Osteopenia 08/10/2016   Chronic depression 11/11/2014   JOINT PAIN, HAND 12/30/2008   TRIGGER FINGER 12/30/2008   OCCUPATIONAL THERAPY DISCHARGE SUMMARY  Visits from Start of Care: 5  Current functional level related to goals / functional outcomes: See above. Patient called clinic to let OT know that MD follow up went well and Dr. Veverly Fells stated that she could stop therapy and continue with HEP at home. Spoke with patient and reviewed HEP over the phone. Discharge is agreed upon.    Remaining  deficits: See above   Education / Equipment: See above   Patient agrees to discharge. Patient goals were partially met. Patient is being discharged due to being pleased with the current functional level.Ailene Ravel, OTR/L,CBIS  [phone]  06/14/2021, 6:08 PM  [facility] [address] [place], [place], [address] Phone: [phone]   Fax:  [phone]  Name: Jniya Madara MRN: [id number] Date of Birth: 08/31/1950

## 2021-06-15 ENCOUNTER — Other Ambulatory Visit: Payer: Self-pay

## 2021-06-15 ENCOUNTER — Encounter: Payer: Self-pay | Admitting: Family Medicine

## 2021-06-15 ENCOUNTER — Ambulatory Visit (INDEPENDENT_AMBULATORY_CARE_PROVIDER_SITE_OTHER): Payer: Medicare Other | Admitting: Family Medicine

## 2021-06-15 VITALS — BP 112/70 | HR 80 | Temp 97.2°F | Ht 63.0 in | Wt 140.0 lb

## 2021-06-15 DIAGNOSIS — F325 Major depressive disorder, single episode, in full remission: Secondary | ICD-10-CM

## 2021-06-15 DIAGNOSIS — S42001A Fracture of unspecified part of right clavicle, initial encounter for closed fracture: Secondary | ICD-10-CM | POA: Insufficient documentation

## 2021-06-15 DIAGNOSIS — S42001D Fracture of unspecified part of right clavicle, subsequent encounter for fracture with routine healing: Secondary | ICD-10-CM | POA: Diagnosis not present

## 2021-06-15 DIAGNOSIS — F419 Anxiety disorder, unspecified: Secondary | ICD-10-CM | POA: Diagnosis not present

## 2021-06-15 MED ORDER — BUPROPION HCL ER (SR) 150 MG PO TB12: ORAL_TABLET | ORAL | 3 refills | Status: DC

## 2021-06-15 NOTE — Telephone Encounter (Signed)
Has appointment on 06/15/21

## 2021-06-15 NOTE — Progress Notes (Signed)
   Subjective:    Patient ID: Tiffany Perry, female    DOB: 08-23-1950, 70 y.o.   MRN: [id number]  HPI Anxiety / depression follow up - medication refill Has history of anxiety and depression are doing very well currently.  Depression under very good control.  Would like to continue her medicine Tries to eat healthy and stay physically active She has osteoarthritis of the hands which are stable   Review of Systems     Objective:   Physical Exam  General-in no acute distress Eyes-no discharge Lungs-respiratory rate normal, CTA CV-no murmurs,RRR Extremities skin warm dry no edema Neuro grossly normal Behavior normal, alert       Assessment & Plan:  Anxiety and depression under good control with medication.  Patient tolerating things well.  She would like to continue on medicine she states she functions better with it.  Major depression under full remission  Recommend wellness on the at least every couple year basis and a Medicare annual wellness on a yearly basis Recommended vaccines patient defers Follow-up in 1 year follow-up sooner if she desires to do wellness

## 2021-06-15 NOTE — Patient Instructions (Signed)

## 2021-06-16 ENCOUNTER — Encounter (HOSPITAL_COMMUNITY): Payer: Medicare Other

## 2021-06-21 ENCOUNTER — Ambulatory Visit (HOSPITAL_COMMUNITY): Payer: Medicare Other

## 2021-06-22 ENCOUNTER — Ambulatory Visit (HOSPITAL_COMMUNITY): Payer: Medicare Other

## 2021-06-27 ENCOUNTER — Ambulatory Visit (HOSPITAL_COMMUNITY): Payer: Medicare Other

## 2021-06-28 ENCOUNTER — Ambulatory Visit (HOSPITAL_COMMUNITY): Payer: Medicare Other

## 2021-07-05 ENCOUNTER — Encounter (HOSPITAL_COMMUNITY): Payer: Medicare Other

## 2021-07-07 ENCOUNTER — Encounter (HOSPITAL_COMMUNITY): Payer: Medicare Other

## 2021-08-26 ENCOUNTER — Other Ambulatory Visit: Payer: Medicare Other

## 2021-10-27 ENCOUNTER — Other Ambulatory Visit: Payer: Self-pay | Admitting: Family Medicine

## 2022-01-06 ENCOUNTER — Other Ambulatory Visit: Payer: Self-pay | Admitting: Nurse Practitioner

## 2022-01-06 ENCOUNTER — Telehealth: Payer: Self-pay | Admitting: Family Medicine

## 2022-01-06 MED ORDER — SULFACETAMIDE SODIUM 10 % OP SOLN: 2.0000 [drp] | Freq: Four times a day (QID) | OPHTHALMIC | 0 refills | Status: DC

## 2022-01-06 NOTE — Telephone Encounter (Signed)
Pt calling in with symptoms of pink eye. Eyes red, itchy, crusty and having some discharge. Began first of week. Please advise. Thank you.  (Vigamox is on our protocol) Personal assistant.

## 2022-01-06 NOTE — Telephone Encounter (Signed)
Pt contacted and verbalized understanding.  

## 2022-01-09 DIAGNOSIS — J0140 Acute pansinusitis, unspecified: Secondary | ICD-10-CM | POA: Diagnosis not present

## 2022-01-09 DIAGNOSIS — J3489 Other specified disorders of nose and nasal sinuses: Secondary | ICD-10-CM | POA: Diagnosis not present

## 2022-01-16 ENCOUNTER — Other Ambulatory Visit: Payer: Self-pay | Admitting: Family Medicine

## 2022-01-16 DIAGNOSIS — Z1231 Encounter for screening mammogram for malignant neoplasm of breast: Secondary | ICD-10-CM

## 2022-01-30 ENCOUNTER — Other Ambulatory Visit: Payer: Self-pay | Admitting: Family Medicine

## 2022-02-16 ENCOUNTER — Ambulatory Visit
Admission: RE | Admit: 2022-02-16 | Discharge: 2022-02-16 | Disposition: A | Discharge status: Home / Self Care | Payer: Medicare Other | Source: Ambulatory Visit | Attending: Family Medicine | Admitting: Family Medicine

## 2022-02-16 DIAGNOSIS — Z1231 Encounter for screening mammogram for malignant neoplasm of breast: Secondary | ICD-10-CM | POA: Diagnosis not present

## 2022-02-20 ENCOUNTER — Ambulatory Visit (INDEPENDENT_AMBULATORY_CARE_PROVIDER_SITE_OTHER): Payer: Medicare Other | Admitting: Family Medicine

## 2022-02-20 ENCOUNTER — Other Ambulatory Visit (HOSPITAL_COMMUNITY)
Admission: RE | Admit: 2022-02-20 | Discharge: 2022-02-20 | Disposition: A | Discharge status: Home / Self Care | Payer: Medicare Other | Source: Ambulatory Visit | Attending: Family Medicine | Admitting: Family Medicine

## 2022-02-20 VITALS — BP 138/68 | HR 61 | Temp 97.3°F | Ht 63.0 in | Wt 134.0 lb

## 2022-02-20 DIAGNOSIS — M5432 Sciatica, left side: Secondary | ICD-10-CM

## 2022-02-20 DIAGNOSIS — M79662 Pain in left lower leg: Secondary | ICD-10-CM | POA: Insufficient documentation

## 2022-02-20 LAB — D-DIMER, QUANTITATIVE: D-Dimer, Quant: 0.47 ug/mL-FEU (ref 0.00–0.50)

## 2022-02-20 NOTE — Progress Notes (Signed)
   Subjective:    Patient ID: Tiffany Perry, female    DOB: 06-Jan-1951, 71 y.o.   MRN: [id number]  HPI Left outer leg pain, weakness since last week Thursday  Has iced, taken aleve  Left lower lat leg- last week lower back pain Previous week hiked a lot Last Sunday fine Then mon and yues with pain Sat pivoted and had pain Last week noticed some neurologic sx No tingling  Pt considering cymbalta   Review of Systems     Objective:   Physical Exam Low back more tenderness in the left lower back.  Slight positive straight leg raise Right knee surgically replaced doing well Left knee osteoarthritis Osteoarthritis both hands Tenderness along the calf half inch greater in circumference on left side versus right side  Patient able to walk on her toes and heels     Assessment & Plan:  1. Sciatica of left side Stretches were shown No weakness detected Should get better over 8 to 12 weeks If not dramatically better within 8 to 10 weeks notify us No imaging necessary currently Consider Cymbalta Tylenol use discussed  2. Pain of left calf Check D-dimer if elevated will proceed forward with ultrasound unlikely to be blood clot more likely to be a strain in the muscle - D-dimer, quantitative

## 2022-03-14 DIAGNOSIS — R300 Dysuria: Secondary | ICD-10-CM | POA: Diagnosis not present

## 2022-03-14 DIAGNOSIS — Z01419 Encounter for gynecological examination (general) (routine) without abnormal findings: Secondary | ICD-10-CM | POA: Diagnosis not present

## 2022-03-29 ENCOUNTER — Ambulatory Visit (INDEPENDENT_AMBULATORY_CARE_PROVIDER_SITE_OTHER): Payer: Medicare Other

## 2022-03-29 ENCOUNTER — Encounter: Payer: Self-pay | Admitting: Family Medicine

## 2022-03-29 VITALS — Wt 134.0 lb

## 2022-03-29 DIAGNOSIS — Z0000 Encounter for general adult medical examination without abnormal findings: Secondary | ICD-10-CM | POA: Diagnosis not present

## 2022-03-29 NOTE — Progress Notes (Signed)
Virtual Visit via Telephone Note  I connected with  Tiffany Perry on 03/29/22 at  1:00 PM EDT by telephone and verified that I am speaking with the correct person using two identifiers.  Location: Patient: home Provider: [facility] Persons participating in the virtual visit: patient/Nurse Health Advisor   I discussed the limitations, risks, security and privacy concerns of performing an evaluation and management service by telephone and the availability of in person appointments. The patient expressed understanding and agreed to proceed.  Interactive audio and video telecommunications were attempted between this nurse and patient, however failed, due to patient having technical difficulties OR patient did not have access to video capability.  We continued and completed visit with audio only.  Some vital signs may be absent or patient reported.   Dionisio David, LPN  Subjective:   Tiffany Perry is a 71 y.o. female who presents for Medicare Annual (Subsequent) preventive examination.  Review of Systems     Cardiac Risk Factors include: advanced age (>26mn, >>65women)     Objective:    Today's Vitals   03/29/22 1300  PainSc: 7    There is no height or weight on file to calculate BMI.     03/29/2022    1:06 PM 05/27/2021    9:07 AM 03/22/2021    8:53 AM 03/11/2020   12:21 PM 03/10/2019   11:25 AM 03/05/2019    1:20 PM 01/13/2015    7:51 AM  Advanced Directives  Does Patient Have a Medical Advance Directive? No No No Yes No No No  Type of AScientist, research (medical)Living will     Does patient want to make changes to medical advance directive? No - Patient declined        Copy of HBrazosin Chart?    No - copy requested     Would patient like information on creating a medical advance directive? No - Patient declined No - Patient declined   No - Patient declined  Yes - Educational materials given    Current Medications  (verified) Outpatient Encounter Medications as of 03/29/2022  Medication Sig   Acetaminophen (TYLENOL) 325 MG CAPS Tylenol   buPROPion (WELLBUTRIN SR) 150 MG 12 hr tablet TAKE (1) TABLET BY MOUTH TWICE DAILY.   buPROPion (WELLBUTRIN) 100 MG tablet Take by mouth.   OVER THE COUNTER MEDICATION Calcium and vit d   ALPRAZolam (XANAX) 0.5 MG tablet TAKE 1 TABLET BY MOUTH TWICE DAILY AS NEEDED FOR ANXIETY OR SLEEP. (Patient not taking: Reported on 03/29/2022)   methocarbamol (ROBAXIN) 500 MG tablet methocarbamol 500 mg tablet  Take 1 tablet 3 times a day by oral route as needed. (Patient not taking: Reported on 03/29/2022)   predniSONE (DELTASONE) 20 MG tablet Take 20 mg by mouth 2 (two) times daily. (Patient not taking: Reported on 03/29/2022)   triamcinolone cream (KENALOG) 0.1 % triamcinolone acetonide 0.1 % topical cream (Patient not taking: Reported on 03/29/2022)   No facility-administered encounter medications on file as of 03/29/2022.    Allergies (verified) Codeine and Poison ivy extract   History: Past Medical History:  Diagnosis Date   Depression    GAD (generalized anxiety disorder)    History of iron deficiency anemia    Osteoarthritis    Osteoporosis    Past Surgical History:  Procedure Laterality Date   BIOPSY  03/11/2020   Procedure: BIOPSY;  Surgeon: RRogene Houston MD;  [facility];  Service: Endoscopy;;  acending colon polyp    COLONOSCOPY  11/16/2011   Procedure: COLONOSCOPY;  Surgeon: Rogene Houston, MD;  [facility];  Service: Endoscopy;  Laterality: N/A;  730   COLONOSCOPY N/A 01/13/2015   Procedure: COLONOSCOPY;  Surgeon: Rogene Houston, MD;  [facility]: [facility];  Service: Endoscopy;  Laterality: N/A;  830   COLONOSCOPY N/A 03/11/2020   Procedure: COLONOSCOPY;  Surgeon: Rogene Houston, MD;  [facility]: [facility];  Service: Endoscopy;  Laterality: N/A;  [place] Left    POLYPECTOMY   03/11/2020   Procedure: POLYPECTOMY;  Surgeon: Rogene Houston, MD;  [facility]: [facility];  Service: Endoscopy;;   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   Procedure: TOTAL KNEE ARTHROPLASTY;  Surgeon: Gaynelle Arabian, MD;  Location: [facility];  Service: Orthopedics;  Laterality: Right;  7mn   Family History  Problem Relation Age of Onset   Heart failure Mother    Heart failure Father    Hypertension Father    Heart attack Father    Colon cancer Cousin 570  Breast cancer Neg Hx    Social History   Socioeconomic History   Marital status: Married    Spouse name: TMarcello Moores  Number of children: 4   Years of education: Not on file   Highest education level: Bachelor's degree (e.g., BA, AB, BS)  Occupational History   Not on file  Tobacco Use   Smoking status: Never   Smokeless tobacco: Never  Vaping Use   Vaping Use: Never used  Substance and Sexual Activity   Alcohol use: No   Drug use: No   Sexual activity: Yes    Birth control/protection: Post-menopausal  Other Topics Concern   Not on file  Social History Narrative   Not on file   Social Determinants of Health   Financial Resource Strain: Low Risk  (03/29/2022)   Overall Financial Resource Strain (CARDIA)    Difficulty of Paying Living Expenses: Not hard at all  Food Insecurity: No Food Insecurity (03/29/2022)   Hunger Vital Sign    Worried About Running Out of Food in the Last Year: Never true    Ran Out of Food in the Last Year: Never true  Transportation Needs: No Transportation Needs (03/29/2022)   PRAPARE - THydrologist(Medical): No    Lack of Transportation (Non-Medical): No  Physical Activity: Sufficiently Active (03/29/2022)   Exercise Vital Sign    Days of Exercise per Week: 4 days    Minutes of Exercise per Session: 50 min  Stress: No Stress Concern Present (03/29/2022)   [place]   Feeling of Stress : Not at  all  Social Connections: Moderately Integrated (03/29/2022)   Social Connection and Isolation Panel [NHANES]    Frequency of Communication with Friends and Family: More than three times a week    Frequency of Social Gatherings with Friends and Family: Once a week    Attends Religious Services: More than 4 times per year    Active Member of [employer] Organizations: No    Attends CArchivistMeetings: Never    Marital Status: Married    Tobacco Counseling Counseling given: Not Answered   Clinical Intake:  Pre-visit preparation completed: Yes  Pain : 0-10 Pain Score: 7  Pain Type: Chronic pain Pain  Location: Leg Pain Descriptors / Indicators: Burning, Radiating, Throbbing Pain Onset: Yesterday Pain Frequency: Intermittent     Nutritional Risks: None Diabetes: No  How often do you need to have someone help you when you read instructions, pamphlets, or other written materials from your doctor or pharmacy?: 1 - Never  Diabetic?no  Interpreter Needed?: No  Information entered by :: Kirke Shaggy, LPN   Activities of Daily Living    03/29/2022    1:07 PM  In your present state of health, do you have any difficulty performing the following activities:  Hearing? 0  Vision? 0  Difficulty concentrating or making decisions? 0  Walking or climbing stairs? 1  Dressing or bathing? 0  Doing errands, shopping? 0  Preparing Food and eating ? N  Using the Toilet? N  In the past six months, have you accidently leaked urine? N  Do you have problems with loss of bowel control? N  Managing your Medications? N  Managing your Finances? N  Housekeeping or managing your Housekeeping? N    Patient Care Team: Kathyrn Drown, MD as PCP - General (Family Medicine)  Indicate any recent Medical Services you may have received from other than [facility] providers in the past year (date may be approximate).     Assessment:   This is a routine wellness examination for  Tiffany Perry.  Hearing/Vision screen Hearing Screening - Comments:: No aids Vision Screening - Comments:: Wears glasses- [facility]  Dietary issues and exercise activities discussed: Current Exercise Habits: Home exercise routine, Type of exercise: walking, Time (Minutes): 50, Frequency (Times/Week): 4, Weekly Exercise (Minutes/Week): 200, Intensity: Mild   Goals Addressed             This Visit's Progress    DIET - EAT MORE FRUITS AND VEGETABLES         Depression Screen    03/29/2022    1:04 PM 02/20/2022    9:12 AM 06/15/2021    2:46 PM 06/15/2021    2:45 PM 03/22/2021    8:46 AM 11/25/2020   11:24 AM 04/16/2018   10:24 AM  PHQ 2/9 Scores  PHQ - 2 Score 0 0 0 0 0 0 0  PHQ- 9 Score 0 0 2    2    Fall Risk    03/29/2022    1:07 PM 06/15/2021    2:45 PM 03/22/2021    8:46 AM 11/25/2020   11:24 AM  Fall Risk   Falls in the past year? 0 0 1 0  Number falls in past yr: 0 0 0   Injury with Fall? 0 0 1   Comment   fracture collar bone 5/22   Risk for fall due to : No Fall Risks No Fall Risks Impaired balance/gait No Fall Risks  Follow up Falls prevention discussed Falls evaluation completed Falls evaluation completed;Education provided Falls evaluation completed    FALL RISK PREVENTION PERTAINING TO THE HOME:  Any stairs in or around the home? Yes  If so, are there any without handrails? No  Home free of loose throw rugs in walkways, pet beds, electrical cords, etc? No  Adequate lighting in your home to reduce risk of falls? Yes   ASSISTIVE DEVICES UTILIZED TO PREVENT FALLS:  Life alert? No  Use of a cane, walker or w/c? No  Grab bars in the bathroom? No  Shower chair or bench in shower? Yes  Elevated toilet seat or a handicapped toilet? Yes   Cognitive Function:  03/29/2022    1:09 PM 03/22/2021    8:52 AM  6CIT Screen  What Year? 0 points 0 points  What month? 0 points 0 points  What time? 0 points 0 points  Count back from 20 0 points 0 points   Months in reverse 0 points 0 points  Repeat phrase 0 points 0 points  Total Score 0 points 0 points    Immunizations Immunization History  Administered Date(s) Administered   Influenza,inj,quad, With Preservative 08/31/2018   Influenza-Unspecified 10/02/2018   Moderna Sars-Covid-2 Vaccination 08/23/2019, 09/22/2019   Pneumococcal Polysaccharide-23 02/10/2020   Td 08/14/2005   Unspecified SARS-COV-2 Vaccination 08/23/2019    TDAP status: Due, Education has been provided regarding the importance of this vaccine. Advised may receive this vaccine at local pharmacy or Health Dept. Aware to provide a copy of the vaccination record if obtained from local pharmacy or Health Dept. Verbalized acceptance and understanding.  Flu Vaccine status: Declined, Education has been provided regarding the importance of this vaccine but patient still declined. Advised may receive this vaccine at local pharmacy or Health Dept. Aware to provide a copy of the vaccination record if obtained from local pharmacy or Health Dept. Verbalized acceptance and understanding.  Pneumococcal vaccine status: Due, Education has been provided regarding the importance of this vaccine. Advised may receive this vaccine at local pharmacy or Health Dept. Aware to provide a copy of the vaccination record if obtained from local pharmacy or Health Dept. Verbalized acceptance and understanding.  Covid-19 vaccine status: Completed vaccines  Qualifies for Shingles Vaccine? Yes   Zostavax completed No   Shingrix Completed?: No.    Education has been provided regarding the importance of this vaccine. Patient has been advised to call insurance company to determine out of pocket expense if they have not yet received this vaccine. Advised may also receive vaccine at local pharmacy or Health Dept. Verbalized acceptance and understanding.  Screening Tests Health Maintenance  Topic Date Due   Hepatitis C Screening  Never done   Zoster  Vaccines- Shingrix (1 of 2) Never done   TETANUS/TDAP  08/15/2015   COVID-19 Vaccine (4 - Mixed Product series) 11/17/2019   Pneumonia Vaccine 52+ Years old (2 - PCV) 02/09/2021   INFLUENZA VACCINE  02/28/2022   MAMMOGRAM  02/17/2023   COLONOSCOPY (Pts 45-14yr Insurance coverage will need to be confirmed)  03/11/2025   DEXA SCAN  Completed   HPV VACCINES  Aged Out    Health Maintenance  Health Maintenance Due  Topic Date Due   Hepatitis C Screening  Never done   Zoster Vaccines- Shingrix (1 of 2) Never done   TETANUS/TDAP  08/15/2015   COVID-19 Vaccine (4 - Mixed Product series) 11/17/2019   Pneumonia Vaccine 70 Years old (2 - PCV) 02/09/2021   INFLUENZA VACCINE  02/28/2022    Colorectal cancer screening: Type of screening: Colonoscopy. Completed 03/11/20. Repeat every 5 years  Mammogram status: Completed 02/16/22. Repeat every year  Bone Density status: Completed 04/01/21. Results reflect: Bone density results: OSTEOPOROSIS. Repeat every 2 years.  Lung Cancer Screening: (Low Dose CT Chest recommended if Age 70-80years, 30 pack-year currently smoking OR have quit w/in 15years.) does not qualify.   Additional Screening:  Hepatitis C Screening: does qualify; Completed no  Vision Screening: Recommended annual ophthalmology exams for early detection of glaucoma and other disorders of the eye. Is the patient up to date with their annual eye exam?  Yes  Who is the provider or what is the  name of the office in which the patient attends annual eye exams? Dr.Miller If pt is not established with a provider, would they like to be referred to a provider to establish care? No .   Dental Screening: Recommended annual dental exams for proper oral hygiene  Community Resource Referral / Chronic Care Management: CRR required this visit?  No   CCM required this visit?  No      Plan:     I have personally reviewed and noted the following in the patient's chart:   Medical and social  history Use of alcohol, tobacco or illicit drugs  Current medications and supplements including opioid prescriptions. Patient is not currently taking opioid prescriptions. Functional ability and status Nutritional status Physical activity Advanced directives List of other physicians Hospitalizations, surgeries, and ER visits in previous 12 months Vitals Screenings to include cognitive, depression, and falls Referrals and appointments  In addition, I have reviewed and discussed with patient certain preventive protocols, quality metrics, and best practice recommendations. A written personalized care plan for preventive services as well as general preventive health recommendations were provided to patient.     Dionisio David, LPN   04/14/568   Nurse Notes: none

## 2022-03-29 NOTE — Patient Instructions (Signed)
Tiffany Perry , Thank you for taking time to come for your Medicare Wellness Visit. I appreciate your ongoing commitment to your health goals. Please review the following plan we discussed and let me know if I can assist you in the future.   Screening recommendations/referrals: Colonoscopy: 03/11/20 Mammogram: 02/16/22 Bone Density: 04/01/21 Recommended yearly ophthalmology/optometry visit for glaucoma screening and checkup Recommended yearly dental visit for hygiene and checkup  Vaccinations: Influenza vaccine: n/d Pneumococcal vaccine: 02/10/20 Tdap vaccine: 08/14/05, due if have injury Shingles vaccine: n/d   Covid-19:08/23/19, 09/22/19  Advanced directives: no  Conditions/risks identified: none  Next appointment: Follow up in one year for your annual wellness visit 04/03/23 @ 10 am by phone   Preventive Care 65 Years and Older, Female Preventive care refers to lifestyle choices and visits with your health care provider that can promote health and wellness. What does preventive care include? A yearly physical exam. This is also called an annual well check. Dental exams once or twice a year. Routine eye exams. Ask your health care provider how often you should have your eyes checked. Personal lifestyle choices, including: Daily care of your teeth and gums. Regular physical activity. Eating a healthy diet. Avoiding tobacco and drug use. Limiting alcohol use. Practicing safe sex. Taking low-dose aspirin every day. Taking vitamin and mineral supplements as recommended by your health care provider. What happens during an annual well check? The services and screenings done by your health care provider during your annual well check will depend on your age, overall health, lifestyle risk factors, and family history of disease. Counseling  Your health care provider may ask you questions about your: Alcohol use. Tobacco use. Drug use. Emotional well-being. Home and relationship  well-being. Sexual activity. Eating habits. History of falls. Memory and ability to understand (cognition). Work and work Statistician. Reproductive health. Screening  You may have the following tests or measurements: Height, weight, and BMI. Blood pressure. Lipid and cholesterol levels. These may be checked every 5 years, or more frequently if you are over 100 years old. Skin check. Lung cancer screening. You may have this screening every year starting at age 70 if you have a 30-pack-year history of smoking and currently smoke or have quit within the past 15 years. Fecal occult blood test (FOBT) of the stool. You may have this test every year starting at age 70. Flexible sigmoidoscopy or colonoscopy. You may have a sigmoidoscopy every 5 years or a colonoscopy every 10 years starting at age 70. Hepatitis C blood test. Hepatitis B blood test. Sexually transmitted disease (STD) testing. Diabetes screening. This is done by checking your blood sugar (glucose) after you have not eaten for a while (fasting). You may have this done every 1-3 years. Bone density scan. This is done to screen for osteoporosis. You may have this done starting at age 70. Mammogram. This may be done every 1-2 years. Talk to your health care provider about how often you should have regular mammograms. Talk with your health care provider about your test results, treatment options, and if necessary, the need for more tests. Vaccines  Your health care provider may recommend certain vaccines, such as: Influenza vaccine. This is recommended every year. Tetanus, diphtheria, and acellular pertussis (Tdap, Td) vaccine. You may need a Td booster every 10 years. Zoster vaccine. You may need this after age 70. Pneumococcal 13-valent conjugate (PCV13) vaccine. One dose is recommended after age 70. Pneumococcal polysaccharide (PPSV23) vaccine. One dose is recommended after age 70. Talk to  your health care provider about which  screenings and vaccines you need and how often you need them. This information is not intended to replace advice given to you by your health care provider. Make sure you discuss any questions you have with your health care provider. Document Released: 08/13/2015 Document Revised: 04/05/2016 Document Reviewed: 05/18/2015 Elsevier Interactive Patient Education  2017 [place] Prevention in the Home Falls can cause injuries. They can happen to people of all ages. There are many things you can do to make your home safe and to help prevent falls. What can I do on the outside of my home? Regularly fix the edges of walkways and driveways and fix any cracks. Remove anything that might make you trip as you walk through a door, such as a raised step or threshold. Trim any bushes or trees on the path to your home. Use bright outdoor lighting. Clear any walking paths of anything that might make someone trip, such as rocks or tools. Regularly check to see if handrails are loose or broken. Make sure that both sides of any steps have handrails. Any raised decks and porches should have guardrails on the edges. Have any leaves, snow, or ice cleared regularly. Use sand or salt on walking paths during winter. Clean up any spills in your garage right away. This includes oil or grease spills. What can I do in the bathroom? Use night lights. Install grab bars by the toilet and in the tub and shower. Do not use towel bars as grab bars. Use non-skid mats or decals in the tub or shower. If you need to sit down in the shower, use a plastic, non-slip stool. Keep the floor dry. Clean up any water that spills on the floor as soon as it happens. Remove soap buildup in the tub or shower regularly. Attach bath mats securely with double-sided non-slip rug tape. Do not have throw rugs and other things on the floor that can make you trip. What can I do in the bedroom? Use night lights. Make sure that you have a  light by your bed that is easy to reach. Do not use any sheets or blankets that are too big for your bed. They should not hang down onto the floor. Have a firm chair that has side arms. You can use this for support while you get dressed. Do not have throw rugs and other things on the floor that can make you trip. What can I do in the kitchen? Clean up any spills right away. Avoid walking on wet floors. Keep items that you use a lot in easy-to-reach places. If you need to reach something above you, use a strong step stool that has a grab bar. Keep electrical cords out of the way. Do not use floor polish or wax that makes floors slippery. If you must use wax, use non-skid floor wax. Do not have throw rugs and other things on the floor that can make you trip. What can I do with my stairs? Do not leave any items on the stairs. Make sure that there are handrails on both sides of the stairs and use them. Fix handrails that are broken or loose. Make sure that handrails are as long as the stairways. Check any carpeting to make sure that it is firmly attached to the stairs. Fix any carpet that is loose or worn. Avoid having throw rugs at the top or bottom of the stairs. If you do have throw rugs, attach  them to the floor with carpet tape. Make sure that you have a light switch at the top of the stairs and the bottom of the stairs. If you do not have them, ask someone to add them for you. What else can I do to help prevent falls? Wear shoes that: Do not have high heels. Have rubber bottoms. Are comfortable and fit you well. Are closed at the toe. Do not wear sandals. If you use a stepladder: Make sure that it is fully opened. Do not climb a closed stepladder. Make sure that both sides of the stepladder are locked into place. Ask someone to hold it for you, if possible. Clearly mark and make sure that you can see: Any grab bars or handrails. First and last steps. Where the edge of each step  is. Use tools that help you move around (mobility aids) if they are needed. These include: Canes. Walkers. Scooters. Crutches. Turn on the lights when you go into a dark area. Replace any light bulbs as soon as they burn out. Set up your furniture so you have a clear path. Avoid moving your furniture around. If any of your floors are uneven, fix them. If there are any pets around you, be aware of where they are. Review your medicines with your doctor. Some medicines can make you feel dizzy. This can increase your chance of falling. Ask your doctor what other things that you can do to help prevent falls. This information is not intended to replace advice given to you by your health care provider. Make sure you discuss any questions you have with your health care provider. Document Released: 05/13/2009 Document Revised: 12/23/2015 Document Reviewed: 08/21/2014 Elsevier Interactive Patient Education  2017 [employer].

## 2022-03-30 MED ORDER — DULOXETINE HCL 30 MG PO CPEP: ORAL_CAPSULE | ORAL | 1 refills | Status: DC

## 2022-03-30 NOTE — Telephone Encounter (Signed)
Nurses Please let Tiffany Perry know that I did review over her message.  I recommend sending in prescription for duloxetine 30 mg 1 daily, #90, 1 refill  I agree to stop bupropion at this time.  Please follow-up by late this fall so we can see how this medication is working for you.  Thanks-Dr. Nicki Reaper

## 2022-04-27 ENCOUNTER — Encounter: Payer: Self-pay | Admitting: Family Medicine

## 2022-04-28 ENCOUNTER — Other Ambulatory Visit: Payer: Self-pay | Admitting: Family Medicine

## 2022-04-28 MED ORDER — BUPROPION HCL ER (SR) 150 MG PO TB12: ORAL_TABLET | ORAL | 5 refills | Status: DC

## 2022-06-01 DIAGNOSIS — M25562 Pain in left knee: Secondary | ICD-10-CM | POA: Diagnosis not present

## 2022-06-19 DIAGNOSIS — M25562 Pain in left knee: Secondary | ICD-10-CM | POA: Diagnosis not present

## 2022-07-12 DIAGNOSIS — M1712 Unilateral primary osteoarthritis, left knee: Secondary | ICD-10-CM | POA: Diagnosis not present

## 2022-07-12 DIAGNOSIS — S83242A Other tear of medial meniscus, current injury, left knee, initial encounter: Secondary | ICD-10-CM | POA: Diagnosis not present

## 2022-08-04 ENCOUNTER — Other Ambulatory Visit: Payer: Self-pay | Admitting: Family Medicine

## 2022-09-08 DIAGNOSIS — S83242A Other tear of medial meniscus, current injury, left knee, initial encounter: Secondary | ICD-10-CM | POA: Diagnosis not present

## 2022-09-08 DIAGNOSIS — M25562 Pain in left knee: Secondary | ICD-10-CM | POA: Diagnosis not present

## 2022-10-26 ENCOUNTER — Encounter: Payer: Self-pay | Admitting: Family Medicine

## 2022-10-27 NOTE — Telephone Encounter (Signed)
Tiffany Salt G, DO     Given symptoms and upcoming procedure, I would recommend that she be seen. I am overbooked today. Recommend Urgent care visit. Would not recommend procedure if she is not back to normal over the weekend.

## 2022-10-30 ENCOUNTER — Encounter: Payer: Self-pay | Admitting: Family Medicine

## 2022-10-30 ENCOUNTER — Ambulatory Visit (INDEPENDENT_AMBULATORY_CARE_PROVIDER_SITE_OTHER): Payer: Medicare Other | Admitting: Family Medicine

## 2022-10-30 VITALS — Wt 129.8 lb

## 2022-10-30 DIAGNOSIS — J019 Acute sinusitis, unspecified: Secondary | ICD-10-CM

## 2022-10-30 MED ORDER — ONDANSETRON HCL 8 MG PO TABS: 8.0000 mg | ORAL_TABLET | Freq: Three times a day (TID) | ORAL | 1 refills | Status: DC | PRN

## 2022-10-30 MED ORDER — AMOXICILLIN-POT CLAVULANATE 875-125 MG PO TABS: 1.0000 | ORAL_TABLET | Freq: Two times a day (BID) | ORAL | 0 refills | Status: DC

## 2022-10-30 NOTE — Progress Notes (Signed)
   Subjective:    Patient ID: Tiffany Perry, female    DOB: [birth year], 72 y.o.   MRN: [id number]  HPI Patient started all last week where fatigue tiredness early in the week that got worse as the week went on with head congestion sore throat coughing not feeling good some nausea also extreme fatigue and low energy wanting to lay down denied night sweats denied wheezing or difficulty breathing but did relate lack of energy  COVID test at home negative  Husband also came down with similar symptoms but his was not as bad. Review of Systems     Objective:   Physical Exam  Gen-NAD not toxic TMS-normal bilateral T- normal no redness Chest-CTA respiratory rate normal no crackles CV RRR no murmur Skin-warm dry Neuro-grossly normal  I believe the patient started off with a viral illness and this opened up the door for secondary infection     Assessment & Plan:   Acute rhinosinusitis Recent viral illness Possible bronchitis Hold off on lab work and x-rays currently Augmentin twice daily with snack and liquids for 7 days Not wise to do surgery currently recommend stopping this.  Will notify her surgeons office Do not feel x-rays lab work indicated unless her symptoms get worse she is to give [place] update in 48 hours  I did connect with her orthopedics office the PA.  They will connect with the patient to reschedule.

## 2022-11-06 ENCOUNTER — Other Ambulatory Visit: Payer: Self-pay | Admitting: Family Medicine

## 2022-11-08 DIAGNOSIS — H52223 Regular astigmatism, bilateral: Secondary | ICD-10-CM | POA: Diagnosis not present

## 2022-11-08 DIAGNOSIS — H5211 Myopia, right eye: Secondary | ICD-10-CM | POA: Diagnosis not present

## 2022-11-08 DIAGNOSIS — H53143 Visual discomfort, bilateral: Secondary | ICD-10-CM | POA: Diagnosis not present

## 2022-11-08 DIAGNOSIS — H524 Presbyopia: Secondary | ICD-10-CM | POA: Diagnosis not present

## 2022-11-08 DIAGNOSIS — H5202 Hypermetropia, left eye: Secondary | ICD-10-CM | POA: Diagnosis not present

## 2022-11-08 DIAGNOSIS — H2513 Age-related nuclear cataract, bilateral: Secondary | ICD-10-CM | POA: Diagnosis not present

## 2022-11-22 DIAGNOSIS — G8918 Other acute postprocedural pain: Secondary | ICD-10-CM | POA: Diagnosis not present

## 2022-11-22 DIAGNOSIS — S83242A Other tear of medial meniscus, current injury, left knee, initial encounter: Secondary | ICD-10-CM | POA: Diagnosis not present

## 2022-12-04 ENCOUNTER — Other Ambulatory Visit: Payer: Self-pay | Admitting: Family Medicine

## 2022-12-26 DIAGNOSIS — Z471 Aftercare following joint replacement surgery: Secondary | ICD-10-CM | POA: Diagnosis not present

## 2022-12-26 DIAGNOSIS — M25462 Effusion, left knee: Secondary | ICD-10-CM | POA: Diagnosis not present

## 2023-01-02 DIAGNOSIS — M25562 Pain in left knee: Secondary | ICD-10-CM | POA: Diagnosis not present

## 2023-01-03 ENCOUNTER — Ambulatory Visit (INDEPENDENT_AMBULATORY_CARE_PROVIDER_SITE_OTHER): Payer: Medicare Other | Admitting: Family Medicine

## 2023-01-03 VITALS — Temp 97.0°F | Wt 129.0 lb

## 2023-01-03 DIAGNOSIS — J019 Acute sinusitis, unspecified: Secondary | ICD-10-CM

## 2023-01-03 MED ORDER — AMOXICILLIN-POT CLAVULANATE 875-125 MG PO TABS: 1.0000 | ORAL_TABLET | Freq: Two times a day (BID) | ORAL | 0 refills | Status: DC

## 2023-01-03 NOTE — Progress Notes (Signed)
   Subjective:    Patient ID: Tiffany Perry, female    DOB: [birth year], 72 y.o.   MRN: [id number]  Cough This is a new problem. The current episode started in the past 7 days. Associated symptoms include ear pain, headaches, nasal congestion, rhinorrhea and a sore throat.   Patient started off with head congestion drainage postnasal drainage not feeling good denies high fever chills sweats does relate sinus pressure and pain and discomfort in the frontal and maxillary sinuses will be traveling next week   Review of Systems  HENT:  Positive for ear pain, rhinorrhea and sore throat.   Respiratory:  Positive for cough.   Neurological:  Positive for headaches.       Objective:   Physical Exam  Gen-NAD not toxic TMS-normal bilateral T- normal no redness Chest-CTA respiratory rate normal no crackles CV RRR no murmur Skin-warm dry Neuro-grossly normal Has bilateral pain discomfort in the maxillary and frontal sinus      Assessment & Plan:   Sinusitis Antibiotics prescribed Patient to rest is much as possible Take antibiotics for at least 7 days but is much as 14 days Report to [place] if any progressive troubles

## 2023-01-05 DIAGNOSIS — M25562 Pain in left knee: Secondary | ICD-10-CM | POA: Diagnosis not present

## 2023-01-08 DIAGNOSIS — M25562 Pain in left knee: Secondary | ICD-10-CM | POA: Diagnosis not present

## 2023-01-10 DIAGNOSIS — M25562 Pain in left knee: Secondary | ICD-10-CM | POA: Diagnosis not present

## 2023-01-17 DIAGNOSIS — M25562 Pain in left knee: Secondary | ICD-10-CM | POA: Diagnosis not present

## 2023-01-19 DIAGNOSIS — M25562 Pain in left knee: Secondary | ICD-10-CM | POA: Diagnosis not present

## 2023-01-24 DIAGNOSIS — M25562 Pain in left knee: Secondary | ICD-10-CM | POA: Diagnosis not present

## 2023-01-26 DIAGNOSIS — M25562 Pain in left knee: Secondary | ICD-10-CM | POA: Diagnosis not present

## 2023-01-30 DIAGNOSIS — M25562 Pain in left knee: Secondary | ICD-10-CM | POA: Diagnosis not present

## 2023-02-23 DIAGNOSIS — M1712 Unilateral primary osteoarthritis, left knee: Secondary | ICD-10-CM | POA: Diagnosis not present

## 2023-03-02 DIAGNOSIS — M1712 Unilateral primary osteoarthritis, left knee: Secondary | ICD-10-CM | POA: Diagnosis not present

## 2023-03-09 DIAGNOSIS — M1712 Unilateral primary osteoarthritis, left knee: Secondary | ICD-10-CM | POA: Diagnosis not present

## 2023-03-12 DIAGNOSIS — M1712 Unilateral primary osteoarthritis, left knee: Secondary | ICD-10-CM | POA: Diagnosis not present

## 2023-03-12 DIAGNOSIS — M25562 Pain in left knee: Secondary | ICD-10-CM | POA: Diagnosis not present

## 2023-03-15 DIAGNOSIS — M1712 Unilateral primary osteoarthritis, left knee: Secondary | ICD-10-CM | POA: Diagnosis not present

## 2023-03-15 DIAGNOSIS — M25562 Pain in left knee: Secondary | ICD-10-CM | POA: Diagnosis not present

## 2023-03-19 DIAGNOSIS — M25562 Pain in left knee: Secondary | ICD-10-CM | POA: Diagnosis not present

## 2023-03-19 DIAGNOSIS — M1712 Unilateral primary osteoarthritis, left knee: Secondary | ICD-10-CM | POA: Diagnosis not present

## 2023-03-20 DIAGNOSIS — M25562 Pain in left knee: Secondary | ICD-10-CM | POA: Diagnosis not present

## 2023-03-20 DIAGNOSIS — M1712 Unilateral primary osteoarthritis, left knee: Secondary | ICD-10-CM | POA: Diagnosis not present

## 2023-03-27 DIAGNOSIS — M1712 Unilateral primary osteoarthritis, left knee: Secondary | ICD-10-CM | POA: Diagnosis not present

## 2023-03-27 DIAGNOSIS — M25562 Pain in left knee: Secondary | ICD-10-CM | POA: Diagnosis not present

## 2023-03-29 DIAGNOSIS — M1712 Unilateral primary osteoarthritis, left knee: Secondary | ICD-10-CM | POA: Diagnosis not present

## 2023-03-29 DIAGNOSIS — M25562 Pain in left knee: Secondary | ICD-10-CM | POA: Diagnosis not present

## 2023-04-03 DIAGNOSIS — M25562 Pain in left knee: Secondary | ICD-10-CM | POA: Diagnosis not present

## 2023-04-03 DIAGNOSIS — M1712 Unilateral primary osteoarthritis, left knee: Secondary | ICD-10-CM | POA: Diagnosis not present

## 2023-04-17 DIAGNOSIS — M25562 Pain in left knee: Secondary | ICD-10-CM | POA: Diagnosis not present

## 2023-04-17 DIAGNOSIS — M1712 Unilateral primary osteoarthritis, left knee: Secondary | ICD-10-CM | POA: Diagnosis not present

## 2023-04-19 DIAGNOSIS — M25562 Pain in left knee: Secondary | ICD-10-CM | POA: Diagnosis not present

## 2023-04-19 DIAGNOSIS — M1712 Unilateral primary osteoarthritis, left knee: Secondary | ICD-10-CM | POA: Diagnosis not present

## 2023-04-20 DIAGNOSIS — S83242A Other tear of medial meniscus, current injury, left knee, initial encounter: Secondary | ICD-10-CM | POA: Diagnosis not present

## 2023-04-20 DIAGNOSIS — M1712 Unilateral primary osteoarthritis, left knee: Secondary | ICD-10-CM | POA: Diagnosis not present

## 2023-04-25 ENCOUNTER — Encounter: Payer: Self-pay | Admitting: Family Medicine

## 2023-04-25 NOTE — Telephone Encounter (Signed)
Nurses I was able to read Pat's message I am glad they are able to help her  Typically 99% of the time orthopedic doctor will want to have a presurgical assessment filled out by the primary care doctor.  This requires an office visit in order to be thorough and address the necessary points.  Given that office visits are filling up quickly I would recommend going ahead and scheduling an office visit in October for a presurgical assessment (Anesthesiology typically will do a preoperative visit a couple days beforehand but this is not the same-if she is uncertain if this is really necessary I would encourage her to talk with her orthopedist-the vast majority of orthopedist will send [place] a presurgical assessment often 2 to 3 weeks before the visit which does not lead best time to fit in the visit without having to scramble therefore I recommend banking ahead and scheduling a presurgical assessment visit in October thank you)

## 2023-05-12 ENCOUNTER — Other Ambulatory Visit: Payer: Self-pay | Admitting: Family Medicine

## 2023-05-23 ENCOUNTER — Encounter: Payer: Self-pay | Admitting: Family Medicine

## 2023-05-23 ENCOUNTER — Ambulatory Visit: Payer: Medicare Other | Admitting: Family Medicine

## 2023-05-23 ENCOUNTER — Other Ambulatory Visit: Payer: Self-pay | Admitting: Family Medicine

## 2023-05-23 VITALS — BP 126/81 | HR 76 | Temp 97.8°F | Wt 131.0 lb

## 2023-05-23 DIAGNOSIS — Z0000 Encounter for general adult medical examination without abnormal findings: Secondary | ICD-10-CM

## 2023-05-23 DIAGNOSIS — Z13820 Encounter for screening for osteoporosis: Secondary | ICD-10-CM

## 2023-05-23 DIAGNOSIS — Z0001 Encounter for general adult medical examination with abnormal findings: Secondary | ICD-10-CM

## 2023-05-23 DIAGNOSIS — E559 Vitamin D deficiency, unspecified: Secondary | ICD-10-CM

## 2023-05-23 DIAGNOSIS — R5383 Other fatigue: Secondary | ICD-10-CM | POA: Diagnosis not present

## 2023-05-23 DIAGNOSIS — Z1231 Encounter for screening mammogram for malignant neoplasm of breast: Secondary | ICD-10-CM

## 2023-05-23 DIAGNOSIS — Z13220 Encounter for screening for lipoid disorders: Secondary | ICD-10-CM

## 2023-05-23 NOTE — Progress Notes (Unsigned)
   Subjective:    Patient ID: Tiffany Perry, female    DOB: [birth year], 72 y.o.   MRN: [id number]  HPI Patient can swim half a mile and 26 minutes does this 2-3 times per week also does strength training twice a week.  No shortness of breath or chest pain with this  Encounter for subsequent annual wellness visit (AWV) in Medicare patient - Plan: CBC with Differential, Basic Metabolic Panel, Lipid Panel, Hepatic Function Panel, TSH + free T4  Other fatigue - Plan: CBC with Differential, Basic Metabolic Panel, Hepatic Function Panel, TSH + free T4  Screening, lipid - Plan: Lipid Panel  Vitamin D deficiency - Plan: Hepatic Function Panel AWV- Annual Wellness Visit  The patient was seen for their annual wellness visit. The patient's past medical history, surgical history, and family history were reviewed. Pertinent vaccines were reviewed ( tetanus, pneumonia, shingles, flu) The patient's medication list was reviewed and updated.  The height and weight were entered.  BMI recorded in electronic record elsewhere  Cognitive screening was completed. Cognitive doing well   Falls /depression screening electronically recorded within record elsewhere  Current tobacco usage: Does not smoke (All patients who use tobacco were given written and verbal information on quitting)  Recent listing of emergency department/hospitalizations over the past year were reviewed.  current specialist the patient sees on a regular basis: None   Medicare annual wellness visit patient questionnaire was reviewed.  A written screening schedule for the patient for the next 5-10 years was given. Appropriate discussion of followup regarding next visit was discussed.     Patient has upcoming surgery She is able to exert herself fairly well with swimming for 25 to 30 minutes without any significant shortness of breath chest pain or discomfort she has had previous knee surgery did well with that  Review  of Systems     Objective:   Physical Exam  General-in no acute distress Eyes-no discharge Lungs-respiratory rate normal, CTA CV-no murmurs,RRR Extremities skin warm dry no edema Neuro grossly normal Behavior normal, alert       Assessment & Plan:   1. Other fatigue Continue Wellbutrin Healthy diet Take rest when necessary  - CBC with Differential - Basic Metabolic Panel - Hepatic Function Panel - TSH + free T4  2. Screening, lipid Ordered - Lipid Panel  3. Vitamin D deficiency Ordered - Hepatic Function Panel  4. Encounter for subsequent annual wellness visit (AWV) in Medicare patient Ordered - CBC with Differential - Basic Metabolic Panel - Lipid Panel - Hepatic Function Panel - TSH + free T4 Presurgical assessment patient actually doing well I think she would do fine with her surgery.  But she will notify us if any setbacks or problems.  Patient not having any cardiac symptoms Energy level overall doing well No chest pressure shortness of breath with swimming Performs over 4 METS at a given time without difficulty Her risk for surgery is considered reasonable/low risk We will send copy of this note to Dr.Alusio  Bone density ordered through [facility] cardiac scoring test ordered await results strong family history of heart disease

## 2023-05-28 DIAGNOSIS — R5383 Other fatigue: Secondary | ICD-10-CM | POA: Diagnosis not present

## 2023-05-28 DIAGNOSIS — Z13220 Encounter for screening for lipoid disorders: Secondary | ICD-10-CM | POA: Diagnosis not present

## 2023-05-28 DIAGNOSIS — Z0000 Encounter for general adult medical examination without abnormal findings: Secondary | ICD-10-CM | POA: Diagnosis not present

## 2023-05-28 DIAGNOSIS — E559 Vitamin D deficiency, unspecified: Secondary | ICD-10-CM | POA: Diagnosis not present

## 2023-05-29 LAB — CBC WITH DIFFERENTIAL/PLATELET
Basophils Absolute: 0.1 10*3/uL (ref 0.0–0.2)
Basos: 1 %
EOS (ABSOLUTE): 0.1 10*3/uL (ref 0.0–0.4)
Eos: 2 %
Hematocrit: 45.2 % (ref 34.0–46.6)
Hemoglobin: 14.7 g/dL (ref 11.1–15.9)
Immature Grans (Abs): 0 10*3/uL (ref 0.0–0.1)
Immature Granulocytes: 0 %
Lymphocytes Absolute: 1.7 10*3/uL (ref 0.7–3.1)
Lymphs: 28 %
MCH: 31 pg (ref 26.6–33.0)
MCHC: 32.5 g/dL (ref 31.5–35.7)
MCV: 95 fL (ref 79–97)
Monocytes Absolute: 0.6 10*3/uL (ref 0.1–0.9)
Monocytes: 10 %
Neutrophils Absolute: 3.5 10*3/uL (ref 1.4–7.0)
Neutrophils: 59 %
Platelets: 289 10*3/uL (ref 150–450)
RBC: 4.74 x10E6/uL (ref 3.77–5.28)
RDW: 11.4 % — ABNORMAL LOW (ref 11.7–15.4)
WBC: 5.9 10*3/uL (ref 3.4–10.8)

## 2023-05-29 LAB — HEPATIC FUNCTION PANEL
ALT: 13 [IU]/L (ref 0–32)
AST: 20 [IU]/L (ref 0–40)
Albumin: 4.2 g/dL (ref 3.9–4.9)
Alkaline Phosphatase: 105 [IU]/L (ref 44–121)
Bilirubin Total: 0.5 mg/dL (ref 0.0–1.2)
Bilirubin, Direct: 0.16 mg/dL (ref 0.00–0.40)
Total Protein: 6.6 g/dL (ref 6.0–8.5)

## 2023-05-29 LAB — BASIC METABOLIC PANEL (7)
BUN/Creatinine Ratio: 13 (ref 12–28)
BUN: 10 mg/dL (ref 8–27)
CO2: 25 mmol/L (ref 20–29)
Chloride: 102 mmol/L (ref 96–106)
Creatinine, Ser: 0.76 mg/dL (ref 0.57–1.00)
Glucose: 68 mg/dL — ABNORMAL LOW (ref 70–99)
Potassium: 4.8 mmol/L (ref 3.5–5.2)
Sodium: 140 mmol/L (ref 134–144)
eGFR: 84 mL/min/{1.73_m2} (ref >59)

## 2023-05-29 LAB — TSH+FREE T4
Free T4: 1.08 ng/dL (ref 0.82–1.77)
TSH: 1.92 u[IU]/mL (ref 0.450–4.500)

## 2023-05-29 LAB — VITAMIN D 25 HYDROXY (VIT D DEFICIENCY, FRACTURES): Vit D, 25-Hydroxy: 39.7 ng/mL (ref 30.0–100.0)

## 2023-05-29 LAB — LIPID PANEL
Chol/HDL Ratio: 2.2 ratio (ref 0.0–4.4)
Cholesterol, Total: 200 mg/dL — ABNORMAL HIGH (ref 100–199)
HDL: 93 mg/dL (ref >39)
LDL Chol Calc (NIH): 98 mg/dL (ref 0–99)
Triglycerides: 48 mg/dL (ref 0–149)
VLDL Cholesterol Cal: 9 mg/dL (ref 5–40)

## 2023-05-30 DIAGNOSIS — M25562 Pain in left knee: Secondary | ICD-10-CM | POA: Diagnosis not present

## 2023-05-30 DIAGNOSIS — M1712 Unilateral primary osteoarthritis, left knee: Secondary | ICD-10-CM | POA: Diagnosis not present

## 2023-05-31 ENCOUNTER — Ambulatory Visit (HOSPITAL_COMMUNITY)
Admission: RE | Admit: 2023-05-31 | Discharge: 2023-05-31 | Disposition: A | Discharge status: Home / Self Care | Payer: Medicare Other | Source: Ambulatory Visit | Attending: Family Medicine | Admitting: Family Medicine

## 2023-05-31 DIAGNOSIS — Z0000 Encounter for general adult medical examination without abnormal findings: Secondary | ICD-10-CM | POA: Insufficient documentation

## 2023-05-31 DIAGNOSIS — Z780 Asymptomatic menopausal state: Secondary | ICD-10-CM | POA: Insufficient documentation

## 2023-05-31 DIAGNOSIS — M810 Age-related osteoporosis without current pathological fracture: Secondary | ICD-10-CM | POA: Diagnosis not present

## 2023-05-31 DIAGNOSIS — Z13820 Encounter for screening for osteoporosis: Secondary | ICD-10-CM | POA: Insufficient documentation

## 2023-06-02 NOTE — Addendum Note (Signed)
Addended by: Lilyan Punt A on: 06/02/2023 08:56 AM   Modules accepted: Level of Service

## 2023-06-04 ENCOUNTER — Encounter: Payer: Self-pay | Admitting: *Deleted

## 2023-06-14 ENCOUNTER — Telehealth: Payer: Self-pay | Admitting: Family Medicine

## 2023-06-14 NOTE — Telephone Encounter (Signed)
Front Presurgical form was filled out to be faxed back to [facility] along with office visit note from 05/23/2023 and the lab work from 05/28/2023 The form will be brought to the office on Friday, 06/15/2023 when I return

## 2023-06-15 ENCOUNTER — Ambulatory Visit
Admission: RE | Admit: 2023-06-15 | Discharge: 2023-06-15 | Disposition: A | Discharge status: Home / Self Care | Payer: Medicare Other | Source: Ambulatory Visit | Attending: Family Medicine | Admitting: Family Medicine

## 2023-06-15 DIAGNOSIS — Z1231 Encounter for screening mammogram for malignant neoplasm of breast: Secondary | ICD-10-CM | POA: Diagnosis not present

## 2023-06-19 ENCOUNTER — Other Ambulatory Visit (HOSPITAL_COMMUNITY): Payer: Medicare Other

## 2023-06-27 DIAGNOSIS — M1712 Unilateral primary osteoarthritis, left knee: Secondary | ICD-10-CM | POA: Diagnosis not present

## 2023-06-27 DIAGNOSIS — M25762 Osteophyte, left knee: Secondary | ICD-10-CM | POA: Diagnosis not present

## 2023-06-27 DIAGNOSIS — G8918 Other acute postprocedural pain: Secondary | ICD-10-CM | POA: Diagnosis not present

## 2023-06-28 ENCOUNTER — Encounter (HOSPITAL_BASED_OUTPATIENT_CLINIC_OR_DEPARTMENT_OTHER): Payer: Self-pay

## 2023-06-28 ENCOUNTER — Emergency Department (HOSPITAL_BASED_OUTPATIENT_CLINIC_OR_DEPARTMENT_OTHER)
Admission: EM | Admit: 2023-06-28 | Discharge: 2023-06-28 | Disposition: A | Discharge status: Home / Self Care | Payer: Medicare Other | Attending: Emergency Medicine | Admitting: Emergency Medicine

## 2023-06-28 DIAGNOSIS — M7989 Other specified soft tissue disorders: Secondary | ICD-10-CM | POA: Insufficient documentation

## 2023-06-28 DIAGNOSIS — R2240 Localized swelling, mass and lump, unspecified lower limb: Secondary | ICD-10-CM | POA: Diagnosis not present

## 2023-06-28 NOTE — ED Provider Notes (Signed)
[facility] EMERGENCY DEPARTMENT AT [facility] Provider Note   CSN: [id number] Arrival date & time: 06/28/23  1957     History  Chief Complaint  Patient presents with   Leg Swelling    Tiffany Perry is a 72 y.o. female.  72 yo F with a chief complaints of left leg pain and swelling.  The patient just had a knee replacement done yesterday.  She has had more pain and swelling than she anticipated.  She took her normal pain regiment and had fallen asleep and when her husband got home from Thanksgiving dinner she felt the pain was worse and that she had had worsening swelling and so came to the emergency department for evaluation.  She denies any fevers or chills.  Denies trauma.  Has been walking on it and doing exercises as she was instructed.        Home Medications Prior to Admission medications   Medication Sig Start Date End Date Taking? Authorizing Provider  Acetaminophen (TYLENOL) 325 MG CAPS Tylenol    [provider]  ALPRAZolam (XANAX) 0.5 MG tablet TAKE 1 TABLET BY MOUTH TWICE DAILY AS NEEDED FOR ANXIETY OR SLEEP. 08/04/22   Babs Sciara, MD  buPROPion (WELLBUTRIN SR) 150 MG 12 hr tablet TAKE ONE TABLET TWICE DAILY AS DIRECTED. 05/14/23   Babs Sciara, MD  OVER THE COUNTER MEDICATION Calcium and vit d    [provider]  triamcinolone cream (KENALOG) 0.1 %     [provider]      Allergies    Codeine, Poison ivy extract, and Cymbalta [duloxetine hcl]    Review of Systems   Review of Systems  Physical Exam Updated Vital Signs BP (!) 145/86 (BP Location: Left Arm)   Pulse 94   Temp 98.3 F (36.8 C)   Resp 18   Ht 5\' 3"  (1.6 m)   Wt 59.4 kg   SpO2 100%   BMI 23.21 kg/m  Physical Exam Vitals and nursing note reviewed.  Constitutional:      General: She is not in acute distress.    Appearance: She is well-developed. She is not diaphoretic.  HENT:     Head: Normocephalic and atraumatic.  Eyes:      Pupils: Pupils are equal, round, and reactive to light.  Cardiovascular:     Rate and Rhythm: Normal rate and regular rhythm.     Heart sounds: No murmur heard.    No friction rub. No gallop.  Pulmonary:     Effort: Pulmonary effort is normal.     Breath sounds: No wheezing or rales.  Abdominal:     General: There is no distension.     Palpations: Abdomen is soft.     Tenderness: There is no abdominal tenderness.  Musculoskeletal:        General: No tenderness.     Cervical back: Normal range of motion and neck supple.     Comments: Bruising and edema noted to the left lower extremity.  She has a Ace wrap in place and a Mepilex dressing overlying.  There is no obvious erythema or warmth.  Removal of the Ace wrap and Mepilex dressing were deferred by patient.  Pulse motor and sensation intact distally.  Skin:    General: Skin is warm and dry.  Neurological:     Mental Status: She is alert and oriented to person, place, and time.  Psychiatric:        Behavior: Behavior normal.  ED Results / Procedures / Treatments   Labs (all labs ordered are listed, but only abnormal results are displayed) Labs Reviewed - No data to display  EKG None  Radiology No results found.  Procedures Procedures    Medications Ordered in ED Medications - No data to display  ED Course/ Medical Decision Making/ A&P                                 Medical Decision Making  72 yo F with a chief complaints of left leg pain and swelling.  Patient just had knee surgery yesterday.  On my exam I anticipate this is likely the expected amount of edema.  She might also have a postoperative hematoma.  I think less likely the patient has an acute DVT this soon after surgery or surgical wound infection.  I did offer to take her dressing down and further assess.  I offered a DVT study.  She is currently declining both.  Will have her try to follow-up with the [facility] clinic if it is open tomorrow.   Otherwise have her call her orthopedist on Monday.  9:18 PM:  I have discussed the diagnosis/risks/treatment options with the patient and family.  Evaluation and diagnostic testing in the emergency department does not suggest an emergent condition requiring admission or immediate intervention beyond what has been performed at this time.  They will follow up with Ortho. We also discussed returning to the ED immediately if new or worsening sx occur. We discussed the sx which are most concerning (e.g., sudden worsening pain, fever, inability to tolerate by mouth, redness,chest pain, sob) that necessitate immediate return. Medications administered to the patient during their visit and any new prescriptions provided to the patient are listed below.  Medications given during this visit Medications - No data to display   The patient appears reasonably screen and/or stabilized for discharge and I doubt any other medical condition or other [facility] requiring further screening, evaluation, or treatment in the ED at this time prior to discharge.          Final Clinical Impression(s) / ED Diagnoses Final diagnoses:  Leg swelling    Rx / DC Orders ED Discharge Orders     None         Melene Plan, DO 06/28/23 2118

## 2023-06-28 NOTE — Discharge Instructions (Signed)
See if the [facility] urgent care office is open in the morning.  They may be able to take a look at her leg and see what their thoughts are about it.  I think the swelling likely is normal postoperative edema versus hematoma or collection of blood in the leg.  I think is less likely to be infected.  I think is less likely be a blood clot.  Please return if he would like to have this ultrasound.  Please return for fever.  Please return for chest pain or difficulty breathing.

## 2023-06-28 NOTE — ED Triage Notes (Signed)
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

## 2023-07-02 DIAGNOSIS — M25562 Pain in left knee: Secondary | ICD-10-CM | POA: Diagnosis not present

## 2023-07-02 DIAGNOSIS — M25662 Stiffness of left knee, not elsewhere classified: Secondary | ICD-10-CM | POA: Diagnosis not present

## 2023-07-04 DIAGNOSIS — M25562 Pain in left knee: Secondary | ICD-10-CM | POA: Diagnosis not present

## 2023-07-04 DIAGNOSIS — M25662 Stiffness of left knee, not elsewhere classified: Secondary | ICD-10-CM | POA: Diagnosis not present

## 2023-07-06 DIAGNOSIS — M25662 Stiffness of left knee, not elsewhere classified: Secondary | ICD-10-CM | POA: Diagnosis not present

## 2023-07-06 DIAGNOSIS — M25562 Pain in left knee: Secondary | ICD-10-CM | POA: Diagnosis not present

## 2023-07-09 DIAGNOSIS — M25662 Stiffness of left knee, not elsewhere classified: Secondary | ICD-10-CM | POA: Diagnosis not present

## 2023-07-09 DIAGNOSIS — M25562 Pain in left knee: Secondary | ICD-10-CM | POA: Diagnosis not present

## 2023-07-11 DIAGNOSIS — M25562 Pain in left knee: Secondary | ICD-10-CM | POA: Diagnosis not present

## 2023-07-11 DIAGNOSIS — M25662 Stiffness of left knee, not elsewhere classified: Secondary | ICD-10-CM | POA: Diagnosis not present

## 2023-07-13 DIAGNOSIS — M25662 Stiffness of left knee, not elsewhere classified: Secondary | ICD-10-CM | POA: Diagnosis not present

## 2023-07-13 DIAGNOSIS — M25562 Pain in left knee: Secondary | ICD-10-CM | POA: Diagnosis not present

## 2023-07-16 DIAGNOSIS — M25662 Stiffness of left knee, not elsewhere classified: Secondary | ICD-10-CM | POA: Diagnosis not present

## 2023-07-16 DIAGNOSIS — M25562 Pain in left knee: Secondary | ICD-10-CM | POA: Diagnosis not present

## 2023-07-19 DIAGNOSIS — M25662 Stiffness of left knee, not elsewhere classified: Secondary | ICD-10-CM | POA: Diagnosis not present

## 2023-07-19 DIAGNOSIS — M25562 Pain in left knee: Secondary | ICD-10-CM | POA: Diagnosis not present

## 2023-07-27 DIAGNOSIS — M25662 Stiffness of left knee, not elsewhere classified: Secondary | ICD-10-CM | POA: Diagnosis not present

## 2023-07-27 DIAGNOSIS — M25562 Pain in left knee: Secondary | ICD-10-CM | POA: Diagnosis not present

## 2023-07-30 DIAGNOSIS — M25662 Stiffness of left knee, not elsewhere classified: Secondary | ICD-10-CM | POA: Diagnosis not present

## 2023-07-30 DIAGNOSIS — M25562 Pain in left knee: Secondary | ICD-10-CM | POA: Diagnosis not present

## 2023-08-02 DIAGNOSIS — Z471 Aftercare following joint replacement surgery: Secondary | ICD-10-CM | POA: Diagnosis not present

## 2023-08-06 ENCOUNTER — Ambulatory Visit (HOSPITAL_COMMUNITY)
Admission: RE | Admit: 2023-08-06 | Discharge: 2023-08-06 | Disposition: A | Discharge status: Home / Self Care | Payer: Self-pay | Source: Ambulatory Visit | Attending: Family Medicine | Admitting: Family Medicine

## 2023-08-06 DIAGNOSIS — Z0000 Encounter for general adult medical examination without abnormal findings: Secondary | ICD-10-CM | POA: Insufficient documentation

## 2023-08-06 DIAGNOSIS — Z13220 Encounter for screening for lipoid disorders: Secondary | ICD-10-CM | POA: Insufficient documentation

## 2023-08-08 DIAGNOSIS — M25562 Pain in left knee: Secondary | ICD-10-CM | POA: Diagnosis not present

## 2023-08-08 DIAGNOSIS — M25662 Stiffness of left knee, not elsewhere classified: Secondary | ICD-10-CM | POA: Diagnosis not present

## 2023-08-15 DIAGNOSIS — M25662 Stiffness of left knee, not elsewhere classified: Secondary | ICD-10-CM | POA: Diagnosis not present

## 2023-08-15 DIAGNOSIS — M25562 Pain in left knee: Secondary | ICD-10-CM | POA: Diagnosis not present

## 2023-08-22 DIAGNOSIS — M25662 Stiffness of left knee, not elsewhere classified: Secondary | ICD-10-CM | POA: Diagnosis not present

## 2023-08-22 DIAGNOSIS — M25562 Pain in left knee: Secondary | ICD-10-CM | POA: Diagnosis not present

## 2023-08-29 DIAGNOSIS — M25562 Pain in left knee: Secondary | ICD-10-CM | POA: Diagnosis not present

## 2023-08-29 DIAGNOSIS — M25662 Stiffness of left knee, not elsewhere classified: Secondary | ICD-10-CM | POA: Diagnosis not present

## 2023-10-02 ENCOUNTER — Other Ambulatory Visit: Payer: Self-pay | Admitting: Family Medicine

## 2023-10-02 ENCOUNTER — Ambulatory Visit: Payer: Self-pay | Admitting: Family Medicine

## 2023-10-02 NOTE — Telephone Encounter (Signed)
 We will see her at her scheduled visit March 5 at 4:10 PM

## 2023-10-02 NOTE — Telephone Encounter (Signed)
 Copied from CRM [id number]. Topic: Clinical - Red Word Triage >> Oct 02, 2023  9:24 AM Fonda Kinder J wrote: Red Word that prompted transfer to Nurse Triage: patches under right breast painful/irritation   Chief Complaint: itchy area under right breast Symptoms: itching/irritation Frequency: ongoing since today Pertinent Negatives: Patient denies pain Disposition: [] ED /[] Urgent Care (no appt availability in office) / [x] Appointment(In office/virtual)/ []  [place] [facility]/ [] Home Care/ [] Refused Recommended Disposition /[] [place] [facility]/ []  Follow-up with PCP Additional Notes:  The patient reported an itchy irritated faint red area underneath her right breast that she described as a blotch.  She is concerned that she may have shingles and due to a previous case she would like to be assessed.  Scheduled the patient for a next day appointment for further evaluation and advised to call back if it spreads or symptoms worsen.  She was agreeable.   Reason for Disposition  [1] MODERATE-SEVERE local itching (i.e., interferes with work, school, activities) AND [2] not improved after 24 hours of hydrocortisone cream  Answer Assessment - Initial Assessment Questions 1. DESCRIPTION: "Describe the itching you are having." "Where is it located?"     Right breast Itchy and irritated looks like stretch marks blotch very slightly red 2. SEVERITY: "How bad is it?"    - MILD: Doesn't interfere with normal activities.   - MODERATE-SEVERE: Interferes with work, school, sleep, or other activities.      Mild  3. SCRATCHING: "Are there any scratch marks? Bleeding?"     None  4. ONSET: "When did the itching begin?"      This morning  5. CAUSE: "What do you think is causing the itching?"      Possibly shingles   6. OTHER SYMPTOMS: "Do you have any other symptoms?"      None  Protocols used: Itching - Localized-A-AH

## 2023-10-03 ENCOUNTER — Ambulatory Visit: Admitting: Family Medicine

## 2023-10-24 DIAGNOSIS — H53143 Visual discomfort, bilateral: Secondary | ICD-10-CM | POA: Diagnosis not present

## 2023-10-24 DIAGNOSIS — H2513 Age-related nuclear cataract, bilateral: Secondary | ICD-10-CM | POA: Diagnosis not present

## 2023-10-24 DIAGNOSIS — H3540 Unspecified peripheral retinal degeneration: Secondary | ICD-10-CM | POA: Diagnosis not present

## 2024-02-12 DIAGNOSIS — M2041 Other hammer toe(s) (acquired), right foot: Secondary | ICD-10-CM | POA: Diagnosis not present

## 2024-02-12 DIAGNOSIS — L84 Corns and callosities: Secondary | ICD-10-CM | POA: Diagnosis not present

## 2024-02-12 DIAGNOSIS — M79674 Pain in right toe(s): Secondary | ICD-10-CM | POA: Diagnosis not present

## 2024-02-25 DIAGNOSIS — L570 Actinic keratosis: Secondary | ICD-10-CM | POA: Diagnosis not present

## 2024-02-25 DIAGNOSIS — L565 Disseminated superficial actinic porokeratosis (DSAP): Secondary | ICD-10-CM | POA: Diagnosis not present

## 2024-02-25 DIAGNOSIS — D23122 Other benign neoplasm of skin of left lower eyelid, including canthus: Secondary | ICD-10-CM | POA: Diagnosis not present

## 2024-02-25 DIAGNOSIS — L814 Other melanin hyperpigmentation: Secondary | ICD-10-CM | POA: Diagnosis not present

## 2024-02-25 DIAGNOSIS — L2089 Other atopic dermatitis: Secondary | ICD-10-CM | POA: Diagnosis not present

## 2024-02-25 DIAGNOSIS — L821 Other seborrheic keratosis: Secondary | ICD-10-CM | POA: Diagnosis not present

## 2024-02-25 DIAGNOSIS — C44319 Basal cell carcinoma of skin of other parts of face: Secondary | ICD-10-CM | POA: Diagnosis not present

## 2024-03-04 ENCOUNTER — Encounter: Payer: Self-pay | Admitting: Family Medicine

## 2024-03-09 ENCOUNTER — Other Ambulatory Visit: Payer: Self-pay | Admitting: Family Medicine

## 2024-03-17 DIAGNOSIS — C44319 Basal cell carcinoma of skin of other parts of face: Secondary | ICD-10-CM | POA: Diagnosis not present

## 2024-03-17 DIAGNOSIS — Z85828 Personal history of other malignant neoplasm of skin: Secondary | ICD-10-CM | POA: Diagnosis not present

## 2024-08-27 ENCOUNTER — Telehealth: Payer: Self-pay

## 2024-08-27 NOTE — Telephone Encounter (Signed)
 Copied from CRM [id number]. Topic: Clinical - Medication Refill >> Aug 27, 2024  2:15 PM Emylou G wrote: Medication: ALPRAZolam  (XANAX ) 0.5 MG tablet  Has the patient contacted their pharmacy? Yes (Agent: If no, request that the patient contact the pharmacy for the refill. If patient does not wish to contact the pharmacy document the reason why and proceed with request.) (Agent: If yes, when and what did the pharmacy advise?) They called us   This is the patient's preferred pharmacy:  [facility] [address] [place], [place], [place] [address] [phone]  Is this the correct pharmacy for this prescription? Yes If no, delete pharmacy and type the correct one.   Has the prescription been filled recently? No  Is the patient out of the medication? Yes  Has the patient been seen for an appointment in the last year OR does the patient have an upcoming appointment? Yes  Can we respond through MyChart? No  Agent: Please be advised that Rx refills may take up to 3 business days. We ask that you follow-up with your pharmacy.

## 2024-08-28 ENCOUNTER — Other Ambulatory Visit: Payer: Self-pay | Admitting: Family Medicine

## 2024-08-28 MED ORDER — ALPRAZOLAM 0.5 MG PO TABS: ORAL_TABLET | ORAL | 0 refills | Status: DC

## 2024-08-28 NOTE — Telephone Encounter (Signed)
 Patient requested a refill of alprazolam  I sent in a limited quantity She does need to do a follow-up I would recommend a follow-up later this spring or no later than early summer with myself  Thanks-Dr. Glendia

## 2024-08-28 NOTE — Telephone Encounter (Signed)
 Left a message for patient to return call to schedule appt. As noted per provider, rx has also been sent to pharmacy

## 2024-08-29 ENCOUNTER — Other Ambulatory Visit: Payer: Self-pay | Admitting: Family Medicine

## 2024-08-29 ENCOUNTER — Telehealth: Payer: Self-pay

## 2024-08-29 MED ORDER — ALPRAZOLAM 0.5 MG PO TABS: ORAL_TABLET | ORAL | 0 refills | Status: AC

## 2024-08-29 NOTE — Telephone Encounter (Signed)
 Called and left voicemail to call back

## 2024-08-29 NOTE — Telephone Encounter (Signed)
 Copied from CRM [id number]. Topic: Clinical - Medication Refill >> Aug 27, 2024  2:15 PM Emylou G wrote: Medication: ALPRAZolam  (XANAX ) 0.5 MG tablet  Has the patient contacted their pharmacy? Yes (Agent: If no, request that the patient contact the pharmacy for the refill. If patient does not wish to contact the pharmacy document the reason why and proceed with request.) (Agent: If yes, when and what did the pharmacy advise?) They called us   This is the patient's preferred pharmacy:  [facility] [address] [place], [place], [place] [address] [phone]  Is this the correct pharmacy for this prescription? Yes If no, delete pharmacy and type the correct one.   Has the prescription been filled recently? No  Is the patient out of the medication? Yes  Has the patient been seen for an appointment in the last year OR does the patient have an upcoming appointment? Yes  Can we respond through MyChart? No  Agent: Please be advised that Rx refills may take up to 3 business days. We ask that you follow-up with your pharmacy.

## 2024-08-29 NOTE — Telephone Encounter (Signed)
 So a refill was sent to [facility] pharmacy We will send it again

## 2024-09-03 ENCOUNTER — Ambulatory Visit: Admitting: Family Medicine

## 2024-09-03 ENCOUNTER — Other Ambulatory Visit: Payer: Self-pay | Admitting: Family Medicine

## 2024-09-03 VITALS — BP 122/82 | HR 83 | Temp 98.4°F | Ht 63.0 in | Wt 134.4 lb

## 2024-09-03 DIAGNOSIS — E559 Vitamin D deficiency, unspecified: Secondary | ICD-10-CM

## 2024-09-03 DIAGNOSIS — J019 Acute sinusitis, unspecified: Secondary | ICD-10-CM

## 2024-09-03 DIAGNOSIS — E785 Hyperlipidemia, unspecified: Secondary | ICD-10-CM

## 2024-09-03 DIAGNOSIS — M810 Age-related osteoporosis without current pathological fracture: Secondary | ICD-10-CM

## 2024-09-03 DIAGNOSIS — Z79899 Other long term (current) drug therapy: Secondary | ICD-10-CM

## 2024-09-03 MED ORDER — AMOXICILLIN 500 MG PO CAPS: 500.0000 mg | ORAL_CAPSULE | Freq: Three times a day (TID) | ORAL | 0 refills | Status: AC

## 2024-09-03 NOTE — Progress Notes (Signed)
" ° °  Subjective:    Patient ID: Tiffany Perry, female    DOB: 1951-07-05, 74 y.o.   MRN: [id number]  HPI  Room 9 Started off last week with headache sore throat not feeling good Progressed into sinus pressure pain discomfort not feeling well Sinus symptoms have progressed into this week and feeling rough this morning denies wheezing or difficulty breathing. Pt is here for a sick visit   Symptoms are: sore throat - cough - congestion - headache - sinus area is sore - drainage -   Pt has tried a netty pot and gargling   Review of Systems     Objective:   Physical Exam  Gen-NAD not toxic TMS-normal bilateral T- normal no redness Chest-CTA respiratory rate normal no crackles CV RRR no murmur Skin-warm dry Neuro-grossly normal       Assessment & Plan:  Acute rhinosinusitis Amoxicillin  3 times daily for 7 to 10 days Warning signs discussed Follow-up if progressive troubles Follow-up if any worrisome issues   "

## 2024-09-05 ENCOUNTER — Other Ambulatory Visit: Payer: Self-pay | Admitting: Family Medicine

## 2024-09-05 DIAGNOSIS — Z1231 Encounter for screening mammogram for malignant neoplasm of breast: Secondary | ICD-10-CM

## 2024-09-08 ENCOUNTER — Ambulatory Visit

## 2024-10-24 ENCOUNTER — Ambulatory Visit

## 2024-10-28 ENCOUNTER — Ambulatory Visit: Admitting: Family Medicine
